# Patient Record
Sex: Female | Born: 1995 | Race: Black or African American | Hispanic: No | Smoking: Former smoker
Health system: Southern US, Community
[De-identification: ages and names within clinical notes are randomized; demographics above are authoritative.]

## PROBLEM LIST (undated history)

## (undated) ENCOUNTER — Inpatient Hospital Stay: Payer: Self-pay

## (undated) DIAGNOSIS — M940 Chondrocostal junction syndrome [Tietze]: Secondary | ICD-10-CM

## (undated) DIAGNOSIS — M549 Dorsalgia, unspecified: Secondary | ICD-10-CM

## (undated) DIAGNOSIS — D649 Anemia, unspecified: Secondary | ICD-10-CM

## (undated) HISTORY — PX: TONSILLECTOMY: SUR1361

---
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
## 2017-01-24 ENCOUNTER — Encounter: Payer: Self-pay | Admitting: Emergency Medicine

## 2017-01-24 ENCOUNTER — Emergency Department: Payer: Medicaid Other

## 2017-01-24 ENCOUNTER — Emergency Department
Admission: EM | Admit: 2017-01-24 | Discharge: 2017-01-24 | Disposition: A | Discharge status: Home / Self Care | Payer: Medicaid Other | Attending: Student in an Organized Health Care Education/Training Program | Admitting: Student in an Organized Health Care Education/Training Program

## 2017-01-24 DIAGNOSIS — M79661 Pain in right lower leg: Secondary | ICD-10-CM

## 2017-01-24 DIAGNOSIS — Z791 Long term (current) use of non-steroidal anti-inflammatories (NSAID): Secondary | ICD-10-CM | POA: Insufficient documentation

## 2017-01-24 DIAGNOSIS — Z79899 Other long term (current) drug therapy: Secondary | ICD-10-CM | POA: Diagnosis not present

## 2017-01-24 DIAGNOSIS — F17210 Nicotine dependence, cigarettes, uncomplicated: Secondary | ICD-10-CM | POA: Insufficient documentation

## 2017-01-24 MED ORDER — CYCLOBENZAPRINE HCL 5 MG PO TABS: 5.0000 mg | ORAL_TABLET | Freq: Three times a day (TID) | ORAL | 0 refills | Status: DC | PRN

## 2017-01-24 MED ORDER — NABUMETONE 750 MG PO TABS: 750.0000 mg | ORAL_TABLET | Freq: Two times a day (BID) | ORAL | 0 refills | Status: DC

## 2017-01-24 NOTE — ED Notes (Signed)
NAD noted at time of D/C. Pt denies questions or concerns. Pt ambulatory to the lobby at this time.  

## 2017-01-24 NOTE — Discharge Instructions (Signed)
Your exam and ultrasound were normal today. You should take the prescription meds as directed. Consider wearing compression socks or a neoprene sleeve. Restart your daily multi-vitamins and hydrate well. Follow-up with Dr. Tracey HarriesPringle for recurrent symptoms.

## 2017-01-24 NOTE — ED Provider Notes (Signed)
[facility] Emergency Department Provider Note ____________________________________________  Time seen: 1545  I have reviewed the triage vital signs and the nursing notes.  HISTORY  Chief Complaint  Leg Pain  HPI Michelle Washington is a 21 y.o. female presents to the ED for evaluation of right calf pain. She reports pain to the posterior calf she describes as "muscle spasms." She reports she has been pain free since the initial onset in October. She reports she dose the prescribed indomethacin for a few days, until symptoms resolved. She did follow-up with her PCP, who suggested she continue the prescribed meds and return is symptoms persisted. The patient denies any injury, trauma, or accidents in the interim. She reports onset last night, but did not dose any meds or start any interventions like ice or heat. She reports the pain as cramping and stabbing. She denies any chest pain, shortness of breath, leg swelling, or skin temp or color changes. She notes the pain is increased with walking and squeezing or touching the calf.   Past Medical History:  Diagnosis Date  . Anemia   . Back pain   . Costochondritis     There are no active problems to display for this patient.   Past Surgical History:  Procedure Laterality Date  . TONSILLECTOMY      Prior to Admission medications   Medication Sig Start Date End Date Taking? Authorizing Provider  azithromycin (ZITHROMAX Z-PAK) 250 MG tablet Take 2 tablets (500 mg) on  Day 1,  followed by 1 tablet (250 mg) once daily on Days 2 through 5. 11/15/16   Enid Derry, PA-C  benzonatate (TESSALON PERLES) 100 MG capsule Take 1 capsule (100 mg total) by mouth 3 (three) times daily as needed for cough. 11/15/16 11/15/17  Enid Derry, PA-C  cyclobenzaprine (FLEXERIL) 5 MG tablet Take 1 tablet (5 mg total) by mouth 3 (three) times daily as needed for muscle spasms. 01/24/17   Cloyce Blankenhorn V Bacon Saul Dorsi, PA-C  diazepam (VALIUM) 5 MG tablet  Take 1 tablet (5 mg total) by mouth every 8 (eight) hours as needed for muscle spasms. 10/04/16   Emily Filbert, MD  ibuprofen (ADVIL,MOTRIN) 800 MG tablet Take 1 tablet (800 mg total) by mouth every 8 (eight) hours as needed. 10/04/16   Emily Filbert, MD  nabumetone (RELAFEN) 750 MG tablet Take 1 tablet (750 mg total) by mouth 2 (two) times daily. 01/24/17   Chelcey Caputo V Bacon Bethel Gaglio, PA-C    Allergies Watermelon [citrullus vulgaris]  History reviewed. No pertinent family history.  Social History Social History  Substance Use Topics  . Smoking status: Current Some Day Smoker    Types: Cigarettes  . Smokeless tobacco: Never Used  . Alcohol use No    Review of Systems  Constitutional: Negative for fever. Cardiovascular: Negative for chest pain. Respiratory: Negative for shortness of breath. Gastrointestinal: Negative for abdominal pain, vomiting and diarrhea. Genitourinary: Negative for dysuria. Musculoskeletal: Negative for back pain. Right calf pain as above.  Skin: Negative for rash. Neurological: Negative for headaches, focal weakness or numbness. ____________________________________________  PHYSICAL EXAM:  VITAL SIGNS: ED Triage Vitals  Enc Vitals Group     BP 01/24/17 1520 126/69     Pulse Rate 01/24/17 1520 83     Resp 01/24/17 1520 18     Temp 01/24/17 1520 98.6 F (37 C)     Temp Source 01/24/17 1520 Oral     SpO2 01/24/17 1520 99 %     Weight  01/24/17 1521 188 lb (85.3 kg)     Height 01/24/17 1521 5\' 5"  (1.651 m)     Head Circumference --      Peak Flow --      Pain Score 01/24/17 1520 10     Pain Loc --      Pain Edu? --      Excl. in GC? --     Constitutional: Alert and oriented. Well appearing and in no distress. Head: Normocephalic and atraumatic. Cardiovascular: Normal rate, regular rhythm. Normal distal pulses. Respiratory: Normal respiratory effort. No wheezes/rales/rhonchi. Gastrointestinal: Soft and nontender. No  distention. Musculoskeletal: Right leg without obvious deformity, joint effusion, swelling, or dislocation. Normal knee exam without internal derangement. Patient is exquisitely tender to light touch and manipulation of the right gastrocnemius. No palpable muscle defect, cords, or hematoma noted. Normal foot/ankle ROM. No achilles tenderness. Nontender with normal range of motion in all extremities.  Neurologic:  Normal gait without ataxia. Normal speech and language. No gross focal neurologic deficits are appreciated. Skin:  Skin is warm, dry and intact. No rash noted. Psychiatric: Mood and affect are normal. Patient exhibits appropriate insight and judgment. ____________________________________________   RADIOLOGY  RLE Venous Ultrasound   IMPRESSION: No evidence of deep venous thrombosis. ____________________________________________  INITIAL IMPRESSION / ASSESSMENT AND PLAN / ED COURSE  Patient with recurrent right calf pain with a negative lower extremity Doppler ultrasound. Patient with otherwise benign exam without any signs of a deep muscle defect, neuromuscular deficit, or vascular insufficiency. She will follow with her primary care provider for ongoing symptom management. She is discharged with prescriptions for Flexeril and Relafen dose directed. She is further advised to consider the use of compression socks or any pain sleep with a right calf. She is also advised she should continue to hydrate well, dose or previously prescribed multivitamins, and consider stretching exercises. Return to the ED as needed for acutely worsening symptoms. ____________________________________________  FINAL CLINICAL IMPRESSION(S) / ED DIAGNOSES  Final diagnoses:  Right calf pain      Lissa HoardJenise V Bacon Sissy Goetzke, PA-C 01/24/17 1953    Willy EddyPatrick Robinson, MD 01/24/17 2106

## 2017-01-24 NOTE — ED Triage Notes (Signed)
Pt c/o cramping muscle spasm in right calf since yesterday.  Pain is intermittent. Ambulatory to triage.  No swelling noted.  No injury noted.  Walks a lot at work.

## 2017-02-15 ENCOUNTER — Encounter: Payer: Self-pay | Admitting: *Deleted

## 2017-02-15 ENCOUNTER — Emergency Department
Admission: EM | Admit: 2017-02-15 | Discharge: 2017-02-15 | Disposition: A | Discharge status: Home / Self Care | Payer: Medicaid Other | Attending: Student in an Organized Health Care Education/Training Program | Admitting: Student in an Organized Health Care Education/Training Program

## 2017-02-15 DIAGNOSIS — Z79899 Other long term (current) drug therapy: Secondary | ICD-10-CM | POA: Diagnosis not present

## 2017-02-15 DIAGNOSIS — Z791 Long term (current) use of non-steroidal anti-inflammatories (NSAID): Secondary | ICD-10-CM | POA: Insufficient documentation

## 2017-02-15 DIAGNOSIS — F17210 Nicotine dependence, cigarettes, uncomplicated: Secondary | ICD-10-CM | POA: Insufficient documentation

## 2017-02-15 DIAGNOSIS — R195 Other fecal abnormalities: Secondary | ICD-10-CM | POA: Diagnosis present

## 2017-02-15 DIAGNOSIS — K602 Anal fissure, unspecified: Secondary | ICD-10-CM | POA: Insufficient documentation

## 2017-02-15 DIAGNOSIS — K921 Melena: Secondary | ICD-10-CM | POA: Diagnosis not present

## 2017-02-15 LAB — CBC
HCT: 42.4 % (ref 35.0–47.0)
Hemoglobin: 13.9 g/dL (ref 12.0–16.0)
MCH: 26.1 pg (ref 26.0–34.0)
MCHC: 32.7 g/dL (ref 32.0–36.0)
MCV: 79.8 fL — ABNORMAL LOW (ref 80.0–100.0)
PLATELETS: 168 10*3/uL (ref 150–440)
RBC: 5.31 MIL/uL — ABNORMAL HIGH (ref 3.80–5.20)
RDW: 13.4 % (ref 11.5–14.5)
WBC: 8 10*3/uL (ref 3.6–11.0)

## 2017-02-15 LAB — COMPREHENSIVE METABOLIC PANEL
ALBUMIN: 3.3 g/dL — AB (ref 3.5–5.0)
ALK PHOS: 38 U/L (ref 38–126)
ALT: 7 U/L — AB (ref 14–54)
AST: 20 U/L (ref 15–41)
Anion gap: 4 — ABNORMAL LOW (ref 5–15)
BILIRUBIN TOTAL: 0.5 mg/dL (ref 0.3–1.2)
BUN: <5 mg/dL — ABNORMAL LOW (ref 6–20)
CO2: 25 mmol/L (ref 22–32)
CREATININE: 0.78 mg/dL (ref 0.44–1.00)
Calcium: 8.7 mg/dL — ABNORMAL LOW (ref 8.9–10.3)
Chloride: 109 mmol/L (ref 101–111)
GFR calc Af Amer: >60 mL/min (ref >60)
GFR calc non Af Amer: >60 mL/min (ref >60)
GLUCOSE: 98 mg/dL (ref 65–99)
Potassium: 3.6 mmol/L (ref 3.5–5.1)
SODIUM: 138 mmol/L (ref 135–145)
TOTAL PROTEIN: 5.9 g/dL — AB (ref 6.5–8.1)

## 2017-02-15 MED ORDER — POLYETHYLENE GLYCOL 3350 17 G PO PACK: 17.0000 g | PACK | Freq: Every day | ORAL | 0 refills | Status: DC

## 2017-02-15 MED ORDER — STARCH 51 % RE SUPP: 1.0000 | RECTAL | 0 refills | Status: DC | PRN

## 2017-02-15 NOTE — ED Provider Notes (Signed)
[facility] Emergency Department Provider Note    First MD Initiated Contact with Patient 02/15/17 1211     (approximate)  I have reviewed the triage vital signs and the nursing notes.   HISTORY  Chief Complaint Blood In Stools    HPI Michelle Washington is a 21 y.o. female presents with bright red blood noticed on the toilet paper after wiping today. Patient states she does have a history of constipation and has been straining. States that she's never really noted any bleeding until yesterday. States that she wiped yesterday noted bloody streaks on the toilet paper. Has not had any significant hematochezia but thinks that her stool is darker than usual. Denies any history of reflux. No history of heartburn. States he also had some episode of right lower quadrant pain over the past week.  No fevers. No nausea or vomiting. No family history of bleeding disorders. No family history of Crohn's or IBS. No history of ulcerative colitis.   Past Medical History:  Diagnosis Date  . Anemia   . Back pain   . Costochondritis    History reviewed. No pertinent family history. Past Surgical History:  Procedure Laterality Date  . TONSILLECTOMY     There are no active problems to display for this patient.     Prior to Admission medications   Medication Sig Start Date End Date Taking? Authorizing Provider  azithromycin (ZITHROMAX Z-PAK) 250 MG tablet Take 2 tablets (500 mg) on  Day 1,  followed by 1 tablet (250 mg) once daily on Days 2 through 5. 11/15/16   Enid Derry, PA-C  benzonatate (TESSALON PERLES) 100 MG capsule Take 1 capsule (100 mg total) by mouth 3 (three) times daily as needed for cough. 11/15/16 11/15/17  Enid Derry, PA-C  cyclobenzaprine (FLEXERIL) 5 MG tablet Take 1 tablet (5 mg total) by mouth 3 (three) times daily as needed for muscle spasms. 01/24/17   Jenise V Bacon Menshew, PA-C  diazepam (VALIUM) 5 MG tablet Take 1 tablet (5 mg total) by mouth  every 8 (eight) hours as needed for muscle spasms. 10/04/16   Emily Filbert, MD  ibuprofen (ADVIL,MOTRIN) 800 MG tablet Take 1 tablet (800 mg total) by mouth every 8 (eight) hours as needed. 10/04/16   Emily Filbert, MD  nabumetone (RELAFEN) 750 MG tablet Take 1 tablet (750 mg total) by mouth 2 (two) times daily. 01/24/17   Jenise Marcelyn Bruins Menshew, PA-C    Allergies Watermelon [citrullus vulgaris]    Social History Social History  Substance Use Topics  . Smoking status: Current Some Day Smoker    Types: Cigarettes  . Smokeless tobacco: Never Used  . Alcohol use No    Review of Systems Patient denies headaches, rhinorrhea, blurry vision, numbness, shortness of breath, chest pain, edema, cough, abdominal pain, nausea, vomiting, diarrhea, dysuria, fevers, rashes or hallucinations unless otherwise stated above in HPI. ____________________________________________   PHYSICAL EXAM:  VITAL SIGNS: Vitals:   02/15/17 1108  BP: 120/75  Pulse: 96  Resp: 15  Temp: 99.1 F (37.3 C)    Constitutional: Alert and oriented. Well appearing and in no acute distress. Eyes: Conjunctivae are normal. PERRL. EOMI. Head: Atraumatic. Nose: No congestion/rhinnorhea. Mouth/Throat: Mucous membranes are moist.  Oropharynx non-erythematous. Neck: No stridor. Painless ROM. No cervical spine tenderness to palpation Hematological/Lymphatic/Immunilogical: No cervical lymphadenopathy. Cardiovascular: Normal rate, regular rhythm. Grossly normal heart sounds.  Good peripheral circulation. Respiratory: Normal respiratory effort.  No retractions. Lungs CTAB. Gastrointestinal: Soft and  nontender. No distention. No abdominal bruits. No CVA tenderness. Genitourinary: No rectal masses. No evidence of fistula. Does have tenderness to palpation on right side of her rectum with streak of bright blood consistent with anal fissure. May have early developing hemorrhoid but no evidence of thrombosed  hemorrhoid. Musculoskeletal: No lower extremity tenderness nor edema.  No joint effusions. Neurologic:  Normal speech and language. No gross focal neurologic deficits are appreciated. No gait instability. Skin:  Skin is warm, dry and intact. No rash noted. Psychiatric: Mood and affect are normal. Speech and behavior are normal.  ____________________________________________   LABS (all labs ordered are listed, but only abnormal results are displayed)  Results for orders placed or performed during the hospital encounter of 02/15/17 (from the past 24 hour(s))  Comprehensive metabolic panel     Status: Abnormal   Collection Time: 02/15/17 11:20 AM  Result Value Ref Range   Sodium 138 135 - 145 mmol/L   Potassium 3.6 3.5 - 5.1 mmol/L   Chloride 109 101 - 111 mmol/L   CO2 25 22 - 32 mmol/L   Glucose, Bld 98 65 - 99 mg/dL   BUN <5 (L) 6 - 20 mg/dL   Creatinine, Ser 1.61 0.44 - 1.00 mg/dL   Calcium 8.7 (L) 8.9 - 10.3 mg/dL   Total Protein 5.9 (L) 6.5 - 8.1 g/dL   Albumin 3.3 (L) 3.5 - 5.0 g/dL   AST 20 15 - 41 U/L   ALT 7 (L) 14 - 54 U/L   Alkaline Phosphatase 38 38 - 126 U/L   Total Bilirubin 0.5 0.3 - 1.2 mg/dL   GFR calc non Af Amer >60 >60 mL/min   GFR calc Af Amer >60 >60 mL/min   Anion gap 4 (L) 5 - 15  CBC     Status: Abnormal   Collection Time: 02/15/17 11:20 AM  Result Value Ref Range   WBC 8.0 3.6 - 11.0 K/uL   RBC 5.31 (H) 3.80 - 5.20 MIL/uL   Hemoglobin 13.9 12.0 - 16.0 g/dL   HCT 09.6 04.5 - 40.9 %   MCV 79.8 (L) 80.0 - 100.0 fL   MCH 26.1 26.0 - 34.0 pg   MCHC 32.7 32.0 - 36.0 g/dL   RDW 81.1 91.4 - 78.2 %   Platelets 168 150 - 440 K/uL   ____________________________________________  ____________________________________________  RADIOLOGY   ____________________________________________   PROCEDURES  Procedure(s) performed:  Procedures    Critical Care performed: no ____________________________________________   INITIAL IMPRESSION / ASSESSMENT AND  PLAN / ED COURSE  Pertinent labs & imaging results that were available during my care of the patient were reviewed by me and considered in my medical decision making (see chart for details).  DDX: hemorrhoid, fissue, procititis, colitis  Michelle Washington is a 21 y.o. who presents to the ED with Concern for blood on tissue paper S described above. Patient afebrile and hemodynamically stable.  Abdominal exam is soft and benign. Blood work is reassuring. There is no leukocytosis. Enough doses clinically consistent with acute surgical process. Not consistent with appendicitis. Less likely IBD. Given history most consistent with anal fissure or hemorrhoid. Will perform a rectal exam.  Clinical Course as of Feb 16 1320  Fri Feb 15, 2017  1317  Rectal exam has probable anal fissure located on right side. Also has a skin tag in that area. No evidence of proctitis. May have early developing hemorrhoid but no evidence of thrombosed hemorrhoids. Patient describing history of straining therefore will give  laxatives. We'll provide Anusol cream and follow-up with PCP.  [PR]    Clinical Course User Index [PR] Willy Eddy, MD     ____________________________________________   FINAL CLINICAL IMPRESSION(S) / ED DIAGNOSES  Final diagnoses:  Anal fissure  Blood in stool      NEW MEDICATIONS STARTED DURING THIS VISIT:  New Prescriptions   No medications on file     Note:  This document was prepared using Dragon voice recognition software and may include unintentional dictation errors.    Willy Eddy, MD 02/15/17 1322

## 2017-02-15 NOTE — ED Notes (Signed)
Pt reports that she had has blood in stool for the last week - blood is bright red in color and started a week ago she was only able to see when she wiped - today the amount increased - pt c/o diarrhea (1 loose stool in 24 hours) with right side pain that is off and on for the last several weeks

## 2017-02-15 NOTE — ED Notes (Signed)
FIRST NURSE: pt c/o bloody stools x1 week.

## 2017-02-15 NOTE — ED Triage Notes (Signed)
PT verbalized having had a couple days of GI bleeding. Pt reports bleeding worsened today and noticed bright red blood on tissue after having a BM. PT reports having diarrhea but is unsure if stool was also dark or black in color. Pt reports being anemic so she is lightheaded and dizzy frequently but is reporting new onset of right lower abd pain. No nausea vomiting.

## 2017-04-06 ENCOUNTER — Emergency Department
Admission: EM | Admit: 2017-04-06 | Discharge: 2017-04-06 | Disposition: A | Discharge status: Home / Self Care | Payer: Medicaid Other | Attending: Emergency Medicine | Admitting: Emergency Medicine

## 2017-04-06 ENCOUNTER — Encounter: Payer: Self-pay | Admitting: Emergency Medicine

## 2017-04-06 DIAGNOSIS — R252 Cramp and spasm: Secondary | ICD-10-CM | POA: Diagnosis present

## 2017-04-06 DIAGNOSIS — F17210 Nicotine dependence, cigarettes, uncomplicated: Secondary | ICD-10-CM | POA: Diagnosis not present

## 2017-04-06 MED ORDER — NAPROXEN 500 MG PO TABS: 500.0000 mg | ORAL_TABLET | Freq: Two times a day (BID) | ORAL | 0 refills | Status: DC

## 2017-04-06 MED ORDER — CYCLOBENZAPRINE HCL 5 MG PO TABS: 5.0000 mg | ORAL_TABLET | Freq: Three times a day (TID) | ORAL | 0 refills | Status: DC | PRN

## 2017-04-06 NOTE — ED Triage Notes (Signed)
Presents with muscle spasm like pain to both lower legs  Pain starts from knee and radiates into lower legs  Denies any other sx' but has had similar episodes in past  Ambulates well to treat room

## 2017-04-06 NOTE — Discharge Instructions (Signed)
Follow up with your primary care provider for symptoms that are not improving over the next few days.  Return to the ER for symptoms that change or worsen if unable to schedule an appointment. 

## 2017-04-06 NOTE — ED Provider Notes (Signed)
[facility] Emergency Department Provider Note ____________________________________________  Time seen: Approximately 9:53 AM  I have reviewed the triage vital signs and the nursing notes.   HISTORY  Chief Complaint Spasms    HPI Michelle Washington is a 21 y.o. female who presents to the emergency department for evaluation of bilateral calf cramping. She has these symptoms off and on. She states that this episode woke her out of her sleep last night and has been intermittentsince. Pain worsens with movement. She states that she was evaluated here a couple of months ago and prescribed some medication that helped, but she doesn't remember what it was. She hasn't taken anything since symptoms returned last night.   Past Medical History:  Diagnosis Date  . Anemia   . Back pain   . Costochondritis     There are no active problems to display for this patient.   Past Surgical History:  Procedure Laterality Date  . TONSILLECTOMY      Prior to Admission medications   Medication Sig Start Date End Date Taking? Authorizing Provider  azithromycin (ZITHROMAX Z-PAK) 250 MG tablet Take 2 tablets (500 mg) on  Day 1,  followed by 1 tablet (250 mg) once daily on Days 2 through 5. 11/15/16   Enid DerryWagner, Ashley, PA-C  benzonatate (TESSALON PERLES) 100 MG capsule Take 1 capsule (100 mg total) by mouth 3 (three) times daily as needed for cough. 11/15/16 11/15/17  Enid DerryWagner, Ashley, PA-C  cyclobenzaprine (FLEXERIL) 5 MG tablet Take 1 tablet (5 mg total) by mouth 3 (three) times daily as needed for muscle spasms. 04/06/17   Aneira Cavitt B, FNP  diazepam (VALIUM) 5 MG tablet Take 1 tablet (5 mg total) by mouth every 8 (eight) hours as needed for muscle spasms. 10/04/16   Emily FilbertWilliams, Jonathan E, MD  ibuprofen (ADVIL,MOTRIN) 800 MG tablet Take 1 tablet (800 mg total) by mouth every 8 (eight) hours as needed. 10/04/16   Emily FilbertWilliams, Jonathan E, MD  nabumetone (RELAFEN) 750 MG tablet Take 1 tablet  (750 mg total) by mouth 2 (two) times daily. 01/24/17   Menshew, Charlesetta IvoryJenise V Bacon, PA-C  naproxen (NAPROSYN) 500 MG tablet Take 1 tablet (500 mg total) by mouth 2 (two) times daily with a meal. 04/06/17   Takia Runyon B, FNP  polyethylene glycol (MIRALAX / GLYCOLAX) packet Take 17 g by mouth daily. Mix one tablespoon with 8oz of your favorite juice or water every day until you are having soft formed stools. Then start taking once daily if you didn't have a stool the day before. 02/15/17   Willy Eddyobinson, Patrick, MD  starch (ANUSOL) 51 % suppository Place 1 suppository rectally as needed for pain. 02/15/17   Willy Eddyobinson, Patrick, MD    Allergies Watermelon [citrullus vulgaris]  No family history on file.  Social History Social History  Substance Use Topics  . Smoking status: Current Some Day Smoker    Types: Cigarettes  . Smokeless tobacco: Never Used  . Alcohol use No    Review of Systems Constitutional: Well appearing. Respiratory: Respirations even and unlabored. Musculoskeletal: Positive for bilateral calf cramping. Skin: Negative for rash, lesion, or wound.  Neurological: Negative for decrease in sensation.  ____________________________________________   PHYSICAL EXAM:  VITAL SIGNS: ED Triage Vitals  Enc Vitals Group     BP -- 122/74     Pulse -- 95     Resp -- 20     Temp -- 98.6     Temp src --  SpO2 --      Weight 04/06/17 0950 188 lb (85.3 kg)     Height 04/06/17 0950 5\' 5"  (1.651 m)     Head Circumference --      Peak Flow --      Pain Score 04/06/17 0949 10     Pain Loc --      Pain Edu? --      Excl. in GC? --     Constitutional: Alert and oriented. Well appearing and in no acute distress. Eyes: Conjunctiva clear without drainage or discharge.  Head: Atraumatic Neck: No stridor. Respiratory: Respirations are even and unlabored. Musculoskeletal: No palpable spasms of the calves noted at time of exam. Neurologic: No loss of sensation or paresthesias.  Skin:  Warm and dry without rash, lesion, or wound.  Psychiatric: Normal behavior and affect.  ____________________________________________   LABS (all labs ordered are listed, but only abnormal results are displayed)  Labs Reviewed - No data to display ____________________________________________  RADIOLOGY  Not indicated. ____________________________________________   PROCEDURES  Procedure(s) performed: None  ____________________________________________   INITIAL IMPRESSION / ASSESSMENT AND PLAN / ED COURSE  Panama Challis is a 21 y.o. female who presents to the emergency department for evaluation of bilateral lower extremity cramping. She will be treated with Flexeril and naprosyn and advised to follow up with her PCP for symptoms that are not improving over the next few days.  Pertinent labs & imaging results that were available during my care of the patient were reviewed by me and considered in my medical decision making (see chart for details).  _________________________________________   FINAL CLINICAL IMPRESSION(S) / ED DIAGNOSES  Final diagnoses:  Muscle cramping    New Prescriptions   NAPROXEN (NAPROSYN) 500 MG TABLET    Take 1 tablet (500 mg total) by mouth 2 (two) times daily with a meal.    If controlled substance prescribed during this visit, 12 month history viewed on the [facility] prior to issuing an initial prescription for Schedule II or III opiod.    Chinita Pester, FNP 04/06/17 1011    Sharman Cheek, MD 04/06/17 1155

## 2017-06-26 ENCOUNTER — Encounter: Payer: Self-pay | Admitting: Emergency Medicine

## 2017-06-26 ENCOUNTER — Emergency Department: Payer: Self-pay

## 2017-06-26 ENCOUNTER — Emergency Department
Admission: EM | Admit: 2017-06-26 | Discharge: 2017-06-26 | Disposition: A | Discharge status: Home / Self Care | Payer: Self-pay | Attending: Student in an Organized Health Care Education/Training Program | Admitting: Student in an Organized Health Care Education/Training Program

## 2017-06-26 DIAGNOSIS — F17290 Nicotine dependence, other tobacco product, uncomplicated: Secondary | ICD-10-CM | POA: Insufficient documentation

## 2017-06-26 DIAGNOSIS — R1031 Right lower quadrant pain: Secondary | ICD-10-CM

## 2017-06-26 DIAGNOSIS — Z79899 Other long term (current) drug therapy: Secondary | ICD-10-CM | POA: Insufficient documentation

## 2017-06-26 LAB — CBC
HCT: 39.9 % (ref 35.0–47.0)
Hemoglobin: 13.1 g/dL (ref 12.0–16.0)
MCH: 26.1 pg (ref 26.0–34.0)
MCHC: 32.9 g/dL (ref 32.0–36.0)
MCV: 79.2 fL — ABNORMAL LOW (ref 80.0–100.0)
PLATELETS: 142 10*3/uL — AB (ref 150–440)
RBC: 5.03 MIL/uL (ref 3.80–5.20)
RDW: 12.6 % (ref 11.5–14.5)
WBC: 7.4 10*3/uL (ref 3.6–11.0)

## 2017-06-26 LAB — COMPREHENSIVE METABOLIC PANEL
ALBUMIN: 4 g/dL (ref 3.5–5.0)
ALK PHOS: 40 U/L (ref 38–126)
ALT: 7 U/L — ABNORMAL LOW (ref 14–54)
AST: 16 U/L (ref 15–41)
Anion gap: 6 (ref 5–15)
BUN: 8 mg/dL (ref 6–20)
CALCIUM: 9.1 mg/dL (ref 8.9–10.3)
CO2: 26 mmol/L (ref 22–32)
Chloride: 107 mmol/L (ref 101–111)
Creatinine, Ser: 0.78 mg/dL (ref 0.44–1.00)
GFR calc Af Amer: >60 mL/min (ref >60)
GLUCOSE: 110 mg/dL — AB (ref 65–99)
Potassium: 3.7 mmol/L (ref 3.5–5.1)
Sodium: 139 mmol/L (ref 135–145)
Total Bilirubin: 0.7 mg/dL (ref 0.3–1.2)
Total Protein: 6.9 g/dL (ref 6.5–8.1)

## 2017-06-26 LAB — URINALYSIS, COMPLETE (UACMP) WITH MICROSCOPIC
Bilirubin Urine: NEGATIVE
Glucose, UA: NEGATIVE mg/dL
HGB URINE DIPSTICK: NEGATIVE
Ketones, ur: NEGATIVE mg/dL
NITRITE: NEGATIVE
Protein, ur: NEGATIVE mg/dL
SPECIFIC GRAVITY, URINE: 1.021 (ref 1.005–1.030)
pH: 7 (ref 5.0–8.0)

## 2017-06-26 LAB — LIPASE, BLOOD: Lipase: 28 U/L (ref 11–51)

## 2017-06-26 MED ORDER — SODIUM CHLORIDE 0.9 % IV BOLUS (SEPSIS)
1000.0000 mL | Freq: Once | INTRAVENOUS | Status: AC
  Administered 2017-06-26: 1000 mL via INTRAVENOUS

## 2017-06-26 MED ORDER — DICYCLOMINE HCL 10 MG PO CAPS: 10.0000 mg | ORAL_CAPSULE | Freq: Three times a day (TID) | ORAL | 0 refills | Status: DC | PRN

## 2017-06-26 MED ORDER — MORPHINE SULFATE (PF) 4 MG/ML IV SOLN
4.0000 mg | INTRAVENOUS | Status: DC | PRN
  Administered 2017-06-26: 4 mg via INTRAVENOUS
  Filled 2017-06-26: qty 1

## 2017-06-26 MED ORDER — MAGNESIUM CITRATE PO SOLN: 1.0000 | Freq: Once | ORAL | 0 refills | Status: AC

## 2017-06-26 MED ORDER — IOPAMIDOL (ISOVUE-300) INJECTION 61%
100.0000 mL | Freq: Once | INTRAVENOUS | Status: AC | PRN
  Administered 2017-06-26: 100 mL via INTRAVENOUS
  Filled 2017-06-26: qty 100

## 2017-06-26 MED ORDER — PROMETHAZINE HCL 25 MG/ML IJ SOLN
12.5000 mg | Freq: Four times a day (QID) | INTRAMUSCULAR | Status: DC | PRN
  Administered 2017-06-26: 12.5 mg via INTRAVENOUS
  Filled 2017-06-26: qty 1

## 2017-06-26 NOTE — ED Notes (Signed)
POCT urine pregnancy negative 

## 2017-06-26 NOTE — ED Provider Notes (Signed)
[facility] Emergency Department Provider Note    First MD Initiated Contact with Patient 06/26/17 1606     (approximate)  I have reviewed the triage vital signs and the nursing notes.   HISTORY  Chief Complaint Abdominal Pain    HPI Michelle Washington is a 21 y.o. female presents from home with chief complaint of one day of progressive worsening 10/10 aching right lower quadrant pain associated with decreased appetite. Has had some chills but no measured fevers. No vomiting. States that she has a history of constipation and thought maybe it's related to her constipation. Denies any vaginal bleeding. No vaginal discharge. No recent abdominal surgeries.   Past Medical History:  Diagnosis Date  . Anemia   . Back pain   . Costochondritis    History reviewed. No pertinent family history. Past Surgical History:  Procedure Laterality Date  . TONSILLECTOMY     There are no active problems to display for this patient.     Prior to Admission medications   Medication Sig Start Date End Date Taking? Authorizing Provider  azithromycin (ZITHROMAX Z-PAK) 250 MG tablet Take 2 tablets (500 mg) on  Day 1,  followed by 1 tablet (250 mg) once daily on Days 2 through 5. 11/15/16   Enid Derry, PA-C  benzonatate (TESSALON PERLES) 100 MG capsule Take 1 capsule (100 mg total) by mouth 3 (three) times daily as needed for cough. 11/15/16 11/15/17  Enid Derry, PA-C  cyclobenzaprine (FLEXERIL) 5 MG tablet Take 1 tablet (5 mg total) by mouth 3 (three) times daily as needed for muscle spasms. 04/06/17   Triplett, Cari B, FNP  diazepam (VALIUM) 5 MG tablet Take 1 tablet (5 mg total) by mouth every 8 (eight) hours as needed for muscle spasms. 10/04/16   Emily Filbert, MD  dicyclomine (BENTYL) 10 MG capsule Take 1 capsule (10 mg total) by mouth 3 (three) times daily as needed for spasms. 06/26/17 07/10/17  Willy Eddy, MD  ibuprofen (ADVIL,MOTRIN) 800 MG tablet Take 1  tablet (800 mg total) by mouth every 8 (eight) hours as needed. 10/04/16   Emily Filbert, MD  magnesium citrate SOLN Take 296 mLs (1 Bottle total) by mouth once. 06/26/17 06/26/17  Willy Eddy, MD  nabumetone (RELAFEN) 750 MG tablet Take 1 tablet (750 mg total) by mouth 2 (two) times daily. 01/24/17   Menshew, Charlesetta Ivory, PA-C  naproxen (NAPROSYN) 500 MG tablet Take 1 tablet (500 mg total) by mouth 2 (two) times daily with a meal. 04/06/17   Triplett, Cari B, FNP  polyethylene glycol (MIRALAX / GLYCOLAX) packet Take 17 g by mouth daily. Mix one tablespoon with 8oz of your favorite juice or water every day until you are having soft formed stools. Then start taking once daily if you didn't have a stool the day before. 02/15/17   Willy Eddy, MD  starch (ANUSOL) 51 % suppository Place 1 suppository rectally as needed for pain. 02/15/17   Willy Eddy, MD    Allergies Watermelon [citrullus vulgaris]    Social History Social History  Substance Use Topics  . Smoking status: Current Some Day Smoker    Packs/day: 1.00    Types: Cigars  . Smokeless tobacco: Never Used  . Alcohol use No    Review of Systems Patient denies headaches, rhinorrhea, blurry vision, numbness, shortness of breath, chest pain, edema, cough, abdominal pain, nausea, vomiting, diarrhea, dysuria, fevers, rashes or hallucinations unless otherwise stated above in HPI. ____________________________________________  PHYSICAL EXAM:  VITAL SIGNS: Vitals:   06/26/17 1546  BP: 117/69  Pulse: (!) 110  Resp: 18  Temp: 99 F (37.2 C)  SpO2: 98%    Constitutional: Alert and oriented. Well appearing and in no acute distress. Eyes: Conjunctivae are normal.  Head: Atraumatic. Nose: No congestion/rhinnorhea. Mouth/Throat: Mucous membranes are moist.   Neck: No stridor. Painless ROM.  Cardiovascular: Normal rate, regular rhythm. Grossly normal heart sounds.  Good peripheral circulation. Respiratory:  Normal respiratory effort.  No retractions. Lungs CTAB. Gastrointestinal: Soft but with acute RLQ ttp, no guarding or rebound. No distention. No abdominal bruits. No CVA tenderness. Genitourinary:  Musculoskeletal: No lower extremity tenderness nor edema.  No joint effusions. Neurologic:  Normal speech and language. No gross focal neurologic deficits are appreciated. No facial droop Skin:  Skin is warm, dry and intact. No rash noted. Psychiatric: Mood and affect are normal. Speech and behavior are normal.  ____________________________________________   LABS (all labs ordered are listed, but only abnormal results are displayed)  Results for orders placed or performed during the hospital encounter of 06/26/17 (from the past 24 hour(s))  Lipase, blood     Status: None   Collection Time: 06/26/17  3:55 PM  Result Value Ref Range   Lipase 28 11 - 51 U/L  Comprehensive metabolic panel     Status: Abnormal   Collection Time: 06/26/17  3:55 PM  Result Value Ref Range   Sodium 139 135 - 145 mmol/L   Potassium 3.7 3.5 - 5.1 mmol/L   Chloride 107 101 - 111 mmol/L   CO2 26 22 - 32 mmol/L   Glucose, Bld 110 (H) 65 - 99 mg/dL   BUN 8 6 - 20 mg/dL   Creatinine, Ser 9.60 0.44 - 1.00 mg/dL   Calcium 9.1 8.9 - 45.4 mg/dL   Total Protein 6.9 6.5 - 8.1 g/dL   Albumin 4.0 3.5 - 5.0 g/dL   AST 16 15 - 41 U/L   ALT 7 (L) 14 - 54 U/L   Alkaline Phosphatase 40 38 - 126 U/L   Total Bilirubin 0.7 0.3 - 1.2 mg/dL   GFR calc non Af Amer >60 >60 mL/min   GFR calc Af Amer >60 >60 mL/min   Anion gap 6 5 - 15  CBC     Status: Abnormal   Collection Time: 06/26/17  3:55 PM  Result Value Ref Range   WBC 7.4 3.6 - 11.0 K/uL   RBC 5.03 3.80 - 5.20 MIL/uL   Hemoglobin 13.1 12.0 - 16.0 g/dL   HCT 09.8 11.9 - 14.7 %   MCV 79.2 (L) 80.0 - 100.0 fL   MCH 26.1 26.0 - 34.0 pg   MCHC 32.9 32.0 - 36.0 g/dL   RDW 82.9 56.2 - 13.0 %   Platelets 142 (L) 150 - 440 K/uL  Urinalysis, Complete w Microscopic     Status:  Abnormal   Collection Time: 06/26/17  3:55 PM  Result Value Ref Range   Color, Urine YELLOW (A) YELLOW   APPearance CLEAR (A) CLEAR   Specific Gravity, Urine 1.021 1.005 - 1.030   pH 7.0 5.0 - 8.0   Glucose, UA NEGATIVE NEGATIVE mg/dL   Hgb urine dipstick NEGATIVE NEGATIVE   Bilirubin Urine NEGATIVE NEGATIVE   Ketones, ur NEGATIVE NEGATIVE mg/dL   Protein, ur NEGATIVE NEGATIVE mg/dL   Nitrite NEGATIVE NEGATIVE   Leukocytes, UA TRACE (A) NEGATIVE   RBC / HPF 0-5 0 - 5 RBC/hpf   WBC,  UA 0-5 0 - 5 WBC/hpf   Bacteria, UA RARE (A) NONE SEEN   Squamous Epithelial / LPF 0-5 (A) NONE SEEN   Mucus PRESENT    ____________________________________________ ____________________________________________  RADIOLOGY  I personally reviewed all radiographic images ordered to evaluate for the above acute complaints and reviewed radiology reports and findings.  These findings were personally discussed with the patient.  Please see medical record for radiology report.  ____________________________________________   PROCEDURES  Procedure(s) performed:  Procedures    Critical Care performed: no ____________________________________________   INITIAL IMPRESSION / ASSESSMENT AND PLAN / ED COURSE  Pertinent labs & imaging results that were available during my care of the patient were reviewed by me and considered in my medical decision making (see chart for details).  DDX: appendicitis, uti, colitis, constipation, stone, toa, cyst  Michelle Gates is a 21 y.o. who presents to the ED with Acute right lower quadrant abdominal pain. Patient does have tenderness and I'm concerned for localized peritonitis. She's afebrile. Blood work does not show any leukocytosis but based on her presentation do believe that she needs CT imaging to rule out any evidence of appendicitis.  We'll give IV fluids as well as IV pain medication.  Clinical Course as of Jun 26 1849  Wed Jun 26, 2017  1738 Patient feels  improved after IV pain medication. CT imaging shows no acute abnormality to explain her symptoms. On repeat exam she does still have some mild tenderness on the right side based on her age will order ultrasound of the ovaries to rule out ovarian torsion.  Again patient denies any vaginal discharge or history of STI's.  [PR]  1847 US Transvaginal Non-OB [PR]    Clinical Course User Index [PR] Willy Eddy, MD     ____________________________________________   FINAL CLINICAL IMPRESSION(S) / ED DIAGNOSES  Final diagnoses:  RLQ abdominal pain      NEW MEDICATIONS STARTED DURING THIS VISIT:  New Prescriptions   DICYCLOMINE (BENTYL) 10 MG CAPSULE    Take 1 capsule (10 mg total) by mouth 3 (three) times daily as needed for spasms.   MAGNESIUM CITRATE SOLN    Take 296 mLs (1 Bottle total) by mouth once.     Note:  This document was prepared using Dragon voice recognition software and may include unintentional dictation errors.    Willy Eddy, MD 06/26/17 [phone]

## 2017-06-26 NOTE — ED Notes (Signed)
Patient transported to CT 

## 2017-06-26 NOTE — Discharge Instructions (Signed)

## 2017-06-26 NOTE — ED Triage Notes (Signed)
Pt presents with right-sided abdominal pain accompanied by constipation that she has had "since I was 18 or 19." She reports that the pain is worse today than it normally is, and that she has been constipated as well. Pt alert & oriented with NAD noted.

## 2017-11-11 ENCOUNTER — Encounter: Payer: Self-pay | Admitting: Emergency Medicine

## 2017-11-11 ENCOUNTER — Emergency Department
Admission: EM | Admit: 2017-11-11 | Discharge: 2017-11-11 | Disposition: A | Discharge status: Home / Self Care | Payer: Self-pay | Attending: Emergency Medicine | Admitting: Emergency Medicine

## 2017-11-11 ENCOUNTER — Other Ambulatory Visit: Payer: Self-pay

## 2017-11-11 ENCOUNTER — Emergency Department: Payer: Self-pay

## 2017-11-11 DIAGNOSIS — F17290 Nicotine dependence, other tobacco product, uncomplicated: Secondary | ICD-10-CM | POA: Insufficient documentation

## 2017-11-11 DIAGNOSIS — Z79899 Other long term (current) drug therapy: Secondary | ICD-10-CM | POA: Insufficient documentation

## 2017-11-11 DIAGNOSIS — B349 Viral infection, unspecified: Secondary | ICD-10-CM | POA: Insufficient documentation

## 2017-11-11 LAB — POCT PREGNANCY, URINE: Preg Test, Ur: NEGATIVE

## 2017-11-11 LAB — GROUP A STREP BY PCR: Group A Strep by PCR: NOT DETECTED

## 2017-11-11 MED ORDER — ONDANSETRON 4 MG PO TBDP: 4.0000 mg | ORAL_TABLET | Freq: Three times a day (TID) | ORAL | 0 refills | Status: DC | PRN

## 2017-11-11 MED ORDER — ONDANSETRON 4 MG PO TBDP
4.0000 mg | ORAL_TABLET | Freq: Once | ORAL | Status: AC
  Administered 2017-11-11: 4 mg via ORAL
  Filled 2017-11-11: qty 1

## 2017-11-11 NOTE — Discharge Instructions (Signed)
Follow-up with your primary care provider if any continued problems. Zofran every 8 hours as needed for nausea. Increase fluids. Tylenol if needed for fever or body aches.

## 2017-11-11 NOTE — ED Triage Notes (Addendum)
Sore throat x 5 days. States has coughed until she vomits during this time but is keeping fluids down.

## 2017-11-11 NOTE — ED Provider Notes (Signed)
[facility] Emergency Department Provider Note   ____________________________________________   First MD Initiated Contact with Patient 11/11/17 0848     (approximate)  I have reviewed the triage vital signs and the nursing notes.   HISTORY  Chief Complaint Sore Throat   HPI Michelle Washington is a 22 y.o. female complaints  of sore throat. Patient states that she has vomited twice this morning.at this time she states that she is able to keep fluids down and denies any diarrhea. She also has had a cough for several days. She denies any fever at home.  She rates her pain as a 10 over 10.   Past Medical History:  Diagnosis Date  . Anemia   . Back pain   . Costochondritis     There are no active problems to display for this patient.   Past Surgical History:  Procedure Laterality Date  . TONSILLECTOMY      Prior to Admission medications   Medication Sig Start Date End Date Taking? Authorizing Provider  dicyclomine (BENTYL) 10 MG capsule Take 1 capsule (10 mg total) by mouth 3 (three) times daily as needed for spasms. 06/26/17 07/10/17  Willy Eddyobinson, Patrick, MD  ondansetron (ZOFRAN ODT) 4 MG disintegrating tablet Take 1 tablet (4 mg total) by mouth every 8 (eight) hours as needed for nausea or vomiting. 11/11/17   Tommi RumpsSummers, Naba Sneed L, PA-C  polyethylene glycol (MIRALAX / GLYCOLAX) packet Take 17 g by mouth daily. Mix one tablespoon with 8oz of your favorite juice or water every day until you are having soft formed stools. Then start taking once daily if you didn't have a stool the day before. 02/15/17   Willy Eddyobinson, Patrick, MD  starch (ANUSOL) 51 % suppository Place 1 suppository rectally as needed for pain. 02/15/17   Willy Eddyobinson, Patrick, MD    Allergies Watermelon [citrullus vulgaris]  No family history on file.  Social History Social History   Tobacco Use  . Smoking status: Current Some Day Smoker    Packs/day: 1.00    Types: Cigars  . Smokeless tobacco:  Never Used  Substance Use Topics  . Alcohol use: No  . Drug use: No    Review of Systems Constitutional: No fever/chills Eyes: No visual changes. ENT: positive sore throat. Cardiovascular: Denies chest pain. Respiratory: Denies shortness of breath. Gastrointestinal: No abdominal pain.  positive nausea, positive vomiting.  No diarrhea.   Genitourinary: Negative for dysuria. Musculoskeletal: Negative for back pain. Skin: Negative for rash. Neurological: Negative for headaches, focal weakness or numbness. ____________________________________________   PHYSICAL EXAM:  VITAL SIGNS: ED Triage Vitals  Enc Vitals Group     BP 11/11/17 0820 (!) 133/92     Pulse Rate 11/11/17 0820 85     Resp 11/11/17 0820 20     Temp 11/11/17 0820 99.6 F (37.6 C)     Temp Source 11/11/17 0820 Oral     SpO2 11/11/17 0820 99 %     Weight 11/11/17 0820 200 lb (90.7 kg)     Height 11/11/17 0820 5\' 6"  (1.676 m)     Head Circumference --      Peak Flow --      Pain Score 11/11/17 0819 10     Pain Loc --      Pain Edu? --      Excl. in GC? --    Constitutional: Alert and oriented. Well appearing and in no acute distress. Eyes: Conjunctivae are normal.  Head: Atraumatic. Nose: No congestion/rhinnorhea.  EACs are clear. TMs are dull bilaterally. Mouth/Throat: Mucous membranes are moist.  Oropharynx non-erythematous. Mild posterior drainage noted. No exudates present. Neck: No stridor.   Hematological/Lymphatic/Immunilogical: No cervical lymphadenopathy. Cardiovascular: Normal rate, regular rhythm. Grossly normal heart sounds.  Good peripheral circulation. Respiratory: Normal respiratory effort.  No retractions. Lungs CTAB. Coarse cough is heard. Gastrointestinal: Soft and nontender. No distention. owel sounds normal active 4 quadrants. Musculoskeletal: moves upper and lower extremities without difficulty. Normal gait was noted. Neurologic:  Normal speech and language. No gross focal neurologic  deficits are appreciated. No gait instability. Skin:  Skin is warm, dry and intact. No rash noted. Psychiatric: Mood and affect are normal. Speech and behavior are normal.  ____________________________________________   LABS (all labs ordered are listed, but only abnormal results are displayed)  Labs Reviewed  GROUP A STREP BY PCR  POC URINE PREG, ED  POCT PREGNANCY, URINE    RADIOLOGY  Dg Chest 2 View  Result Date: 11/11/2017 CLINICAL DATA:  Sore throat, cough EXAM: CHEST  2 VIEW COMPARISON:  11/15/2016 FINDINGS: Heart and mediastinal contours are within normal limits. No focal opacities or effusions. No acute bony abnormality. IMPRESSION: No active cardiopulmonary disease. Electronically Signed   By: Charlett Nose M.D.   On: 11/11/2017 10:05    ____________________________________________   PROCEDURES  Procedure(s) performed: None  Procedures  Critical Care performed: No  ____________________________________________   INITIAL IMPRESSION / ASSESSMENT AND PLAN / ED COURSE Chest x-ray was reassuring and patient was made aware that her test were negative. Most likely she has a viral illness. She is to increase fluids and take Tylenol if needed for fever or body aches. She is also given a prescription for Zofran 1 every 8 hours as needed for nausea. She will follow-up with her PCP if any continued problems.  ____________________________________________   FINAL CLINICAL IMPRESSION(S) / ED DIAGNOSES  Final diagnoses:  Viral illness     ED Discharge Orders        Ordered    ondansetron (ZOFRAN ODT) 4 MG disintegrating tablet  Every 8 hours PRN     11/11/17 1107       Note:  This document was prepared using Dragon voice recognition software and may include unintentional dictation errors.    Tommi Rumps, PA-C 11/11/17 1242    Minna Antis, MD 11/11/17 [phone]

## 2017-11-11 NOTE — ED Notes (Signed)
Pt to ed with c/o sore throat, cough, congestion, and difficulty sleeping since Wednesday.  States each day she has felt worse.  Pt in no acute resp distress at this time.  Pt speaking in full complete sentences. Skin warm and dry.

## 2018-03-18 LAB — HM HIV SCREENING LAB: HM HIV Screening: NEGATIVE

## 2018-03-19 ENCOUNTER — Encounter: Payer: Self-pay | Admitting: Emergency Medicine

## 2018-03-19 ENCOUNTER — Emergency Department: Payer: Self-pay

## 2018-03-19 ENCOUNTER — Emergency Department
Admission: EM | Admit: 2018-03-19 | Discharge: 2018-03-19 | Disposition: A | Discharge status: Home / Self Care | Payer: Self-pay | Attending: Emergency Medicine | Admitting: Emergency Medicine

## 2018-03-19 DIAGNOSIS — Z79899 Other long term (current) drug therapy: Secondary | ICD-10-CM | POA: Insufficient documentation

## 2018-03-19 DIAGNOSIS — F17290 Nicotine dependence, other tobacco product, uncomplicated: Secondary | ICD-10-CM | POA: Insufficient documentation

## 2018-03-19 DIAGNOSIS — M25512 Pain in left shoulder: Secondary | ICD-10-CM | POA: Insufficient documentation

## 2018-03-19 MED ORDER — CYCLOBENZAPRINE HCL 5 MG PO TABS: ORAL_TABLET | ORAL | 0 refills | Status: DC

## 2018-03-19 MED ORDER — IBUPROFEN 800 MG PO TABS: 800.0000 mg | ORAL_TABLET | Freq: Three times a day (TID) | ORAL | 0 refills | Status: DC | PRN

## 2018-03-19 NOTE — ED Triage Notes (Signed)
Pt reports this am she felt her left shoulder pop and now it is painful to move.

## 2018-03-19 NOTE — ED Notes (Signed)
See triage note  She she felt pop in left shoulder this am  No deformity noted

## 2018-03-19 NOTE — ED Provider Notes (Signed)
[facility] Emergency Department Provider Note  ____________________________________________  Time seen: Approximately 2:05 PM  I have reviewed the triage vital signs and the nursing notes.   HISTORY  Chief Complaint Shoulder Pain    HPI Michelle Washington is a 22 y.o. female that presents to the emergency department for evaluation of left shoulder pain  for 1 day.  Patient states that her arm got stuck under her pillow this morning and she heard a pop.  It is painful to move her arm backwards.  No history of shoulder pain.  No numbness, tingling.  Past Medical History:  Diagnosis Date  . Anemia   . Back pain   . Costochondritis     There are no active problems to display for this patient.   Past Surgical History:  Procedure Laterality Date  . TONSILLECTOMY      Prior to Admission medications   Medication Sig Start Date End Date Taking? Authorizing Provider  cyclobenzaprine (FLEXERIL) 5 MG tablet Take 1-2 tablets 3 times daily as needed 03/19/18   Enid Derry, PA-C  dicyclomine (BENTYL) 10 MG capsule Take 1 capsule (10 mg total) by mouth 3 (three) times daily as needed for spasms. 06/26/17 07/10/17  Willy Eddy, MD  ibuprofen (ADVIL,MOTRIN) 800 MG tablet Take 1 tablet (800 mg total) by mouth every 8 (eight) hours as needed. 03/19/18   Enid Derry, PA-C  ondansetron (ZOFRAN ODT) 4 MG disintegrating tablet Take 1 tablet (4 mg total) by mouth every 8 (eight) hours as needed for nausea or vomiting. 11/11/17   Tommi Rumps, PA-C  polyethylene glycol (MIRALAX / GLYCOLAX) packet Take 17 g by mouth daily. Mix one tablespoon with 8oz of your favorite juice or water every day until you are having soft formed stools. Then start taking once daily if you didn't have a stool the day before. 02/15/17   Willy Eddy, MD  starch (ANUSOL) 51 % suppository Place 1 suppository rectally as needed for pain. 02/15/17   Willy Eddy, MD    Allergies Watermelon  [citrullus vulgaris]  No family history on file.  Social History Social History   Tobacco Use  . Smoking status: Current Some Day Smoker    Packs/day: 1.00    Types: Cigars  . Smokeless tobacco: Never Used  Substance Use Topics  . Alcohol use: No  . Drug use: No     Review of Systems  Constitutional: No fever/chills Cardiovascular: No chest pain. Respiratory: No cough.  Musculoskeletal: Positive for shoulder pain. Skin: Negative for rash, abrasions, lacerations, ecchymosis. Neurological: Negative for headaches, numbness or tingling   ____________________________________________   PHYSICAL EXAM:  VITAL SIGNS: ED Triage Vitals  Enc Vitals Group     BP 03/19/18 1230 127/77     Pulse Rate 03/19/18 1230 73     Resp 03/19/18 1230 20     Temp 03/19/18 1230 98.7 F (37.1 C)     Temp Source 03/19/18 1230 Oral     SpO2 03/19/18 1230 100 %     Weight 03/19/18 1227 216 lb (98 kg)     Height 03/19/18 1227  (1.676 m)     Head Circumference --      Peak Flow --      Pain Score 03/19/18 1227 10     Pain Loc --      Pain Edu? --      Excl. in GC? --      Constitutional: Alert and oriented. Well appearing and in no  acute distress. Eyes: Conjunctivae are normal. PERRL. EOMI. Head: Atraumatic. ENT:      Ears:      Nose: No congestion/rhinnorhea.      Mouth/Throat: Mucous membranes are moist.  Neck: No stridor.   Cardiovascular: Normal rate, regular rhythm.  Good peripheral circulation.  Symmetric radial pulses bilaterally. Respiratory: Normal respiratory effort without tachypnea or retractions. Lungs CTAB. Good air entry to the bases with no decreased or absent breath sounds. Musculoskeletal: Full range of motion to all extremities. No gross deformities appreciated.  Pain elicited with extension of left shoulder.  Tenderness to palpation over superior shoulder. Neurologic:  Normal speech and language. No gross focal neurologic deficits are appreciated.  Skin:  Skin  is warm, dry and intact. No rash noted. Psychiatric: Mood and affect are normal. Speech and behavior are normal. Patient exhibits appropriate insight and judgement.   ____________________________________________   LABS (all labs ordered are listed, but only abnormal results are displayed)  Labs Reviewed - No data to display ____________________________________________  EKG   ____________________________________________  RADIOLOGY Lexine Baton, personally viewed and evaluated these images (plain radiographs) as part of my medical decision making, as well as reviewing the written report by the radiologist.  Dg Shoulder Left  Result Date: 03/19/2018 CLINICAL DATA:  Shoulder pain following stretching, initial encounter EXAM: LEFT SHOULDER - 2+ VIEW COMPARISON:  10/04/2016 FINDINGS: There is no evidence of fracture or dislocation. There is no evidence of arthropathy or other focal bone abnormality. Soft tissues are unremarkable. IMPRESSION: No acute abnormality noted. Electronically Signed   By: Alcide Clever M.D.   On: 03/19/2018 13:09    ____________________________________________    PROCEDURES  Procedure(s) performed:    Procedures    Medications - No data to display   ____________________________________________   INITIAL IMPRESSION / ASSESSMENT AND PLAN / ED COURSE  Pertinent labs & imaging results that were available during my care of the patient were reviewed by me and considered in my medical decision making (see chart for details).  Review of the [place] CSRS was performed in accordance of the [facility] prior to dispensing any controlled drugs.   Patient presented to the emergency department for evaluation of shoulder pain for 1 day.  Vital signs and exam are reassuring.  Shoulder x-ray is negative.  Patient will be discharged home with prescriptions for ibuprofen and flexeril.  Sling was given. Patient is to follow up with PCP as directed. Patient is given ED  precautions to return to the ED for any worsening or new symptoms.     ____________________________________________  FINAL CLINICAL IMPRESSION(S) / ED DIAGNOSES  Final diagnoses:  Acute pain of left shoulder      NEW MEDICATIONS STARTED DURING THIS VISIT:  ED Discharge Orders        Ordered    ibuprofen (ADVIL,MOTRIN) 800 MG tablet  Every 8 hours PRN     03/19/18 1423    cyclobenzaprine (FLEXERIL) 5 MG tablet     03/19/18 1423          This chart was dictated using voice recognition software/Dragon. Despite best efforts to proofread, errors can occur which can change the meaning. Any change was purely unintentional.    Enid Derry, PA-C 03/19/18 1502    Emily Filbert, MD 03/19/18 [phone]

## 2018-07-28 ENCOUNTER — Emergency Department
Admission: EM | Admit: 2018-07-28 | Discharge: 2018-07-28 | Disposition: A | Discharge status: Home / Self Care | Payer: BLUE CROSS/BLUE SHIELD | Attending: Emergency Medicine | Admitting: Emergency Medicine

## 2018-07-28 ENCOUNTER — Other Ambulatory Visit: Payer: Self-pay

## 2018-07-28 ENCOUNTER — Encounter: Payer: Self-pay | Admitting: Emergency Medicine

## 2018-07-28 DIAGNOSIS — J029 Acute pharyngitis, unspecified: Secondary | ICD-10-CM | POA: Insufficient documentation

## 2018-07-28 DIAGNOSIS — F17290 Nicotine dependence, other tobacco product, uncomplicated: Secondary | ICD-10-CM | POA: Insufficient documentation

## 2018-07-28 DIAGNOSIS — R0981 Nasal congestion: Secondary | ICD-10-CM | POA: Insufficient documentation

## 2018-07-28 DIAGNOSIS — R070 Pain in throat: Secondary | ICD-10-CM | POA: Diagnosis present

## 2018-07-28 DIAGNOSIS — J3489 Other specified disorders of nose and nasal sinuses: Secondary | ICD-10-CM | POA: Insufficient documentation

## 2018-07-28 LAB — GROUP A STREP BY PCR: GROUP A STREP BY PCR: NOT DETECTED

## 2018-07-28 MED ORDER — FEXOFENADINE-PSEUDOEPHED ER 60-120 MG PO TB12: 1.0000 | ORAL_TABLET | Freq: Two times a day (BID) | ORAL | 0 refills | Status: DC

## 2018-07-28 MED ORDER — LIDOCAINE VISCOUS HCL 2 % MT SOLN: 5.0000 mL | Freq: Four times a day (QID) | OROMUCOSAL | 0 refills | Status: DC | PRN

## 2018-07-28 NOTE — ED Notes (Signed)
Pt verbalizes understanding of d/c instructions, medications and follow up 

## 2018-07-28 NOTE — ED Provider Notes (Signed)
[facility] Emergency Department Provider Note   ____________________________________________   First MD Initiated Contact with Patient 07/28/18 1318     (approximate)  I have reviewed the triage vital signs and the nursing notes.   HISTORY  Chief Complaint Sore Throat and sinus congestion    HPI Michelle Washington is a 22 y.o. female patient presents with sore throat and sinus congestion for 3 days.  Patient states she is able to tolerate fluid and food with discomfort.  Patient intermittent nasal congestion with rhinorrhea.  Patient denies nausea, vomiting, diarrhea.  Patient rates her pain discomfort as a 5/10.  Patient described the pain is "sore".  No palliative measures for complaint.  Patient is status post tonsillectomy. Past Medical History:  Diagnosis Date  . Anemia   . Back pain   . Costochondritis     There are no active problems to display for this patient.   Past Surgical History:  Procedure Laterality Date  . TONSILLECTOMY      Prior to Admission medications   Medication Sig Start Date End Date Taking? Authorizing Provider  cyclobenzaprine (FLEXERIL) 5 MG tablet Take 1-2 tablets 3 times daily as needed 03/19/18   Enid Derry, PA-C  dicyclomine (BENTYL) 10 MG capsule Take 1 capsule (10 mg total) by mouth 3 (three) times daily as needed for spasms. 06/26/17 07/10/17  Willy Eddy, MD  fexofenadine-pseudoephedrine (ALLEGRA-D) 60-120 MG 12 hr tablet Take 1 tablet by mouth 2 (two) times daily. 07/28/18   Joni Reining, PA-C  ibuprofen (ADVIL,MOTRIN) 800 MG tablet Take 1 tablet (800 mg total) by mouth every 8 (eight) hours as needed. 03/19/18   Enid Derry, PA-C  lidocaine (XYLOCAINE) 2 % solution Use as directed 5 mLs in the mouth or throat every 6 (six) hours as needed for mouth pain. For oral swish and swallow. 07/28/18   Joni Reining, PA-C  ondansetron (ZOFRAN ODT) 4 MG disintegrating tablet Take 1 tablet (4 mg total) by mouth  every 8 (eight) hours as needed for nausea or vomiting. 11/11/17   Tommi Rumps, PA-C  polyethylene glycol (MIRALAX / GLYCOLAX) packet Take 17 g by mouth daily. Mix one tablespoon with 8oz of your favorite juice or water every day until you are having soft formed stools. Then start taking once daily if you didn't have a stool the day before. 02/15/17   Willy Eddy, MD  starch (ANUSOL) 51 % suppository Place 1 suppository rectally as needed for pain. 02/15/17   Willy Eddy, MD    Allergies Watermelon [citrullus vulgaris]  No family history on file.  Social History Social History   Tobacco Use  . Smoking status: Current Some Day Smoker    Packs/day: 1.00    Types: Cigars  . Smokeless tobacco: Never Used  Substance Use Topics  . Alcohol use: No  . Drug use: No    Review of Systems Constitutional: No fever/chills Eyes: No visual changes. ENT: Nasal congestion and sore throat. Cardiovascular: Denies chest pain. Respiratory: Denies shortness of breath. Gastrointestinal: No abdominal pain.  No nausea, no vomiting.  No diarrhea.  No constipation. Genitourinary: Negative for dysuria. Musculoskeletal: Negative for back pain. Skin: Negative for rash. Neurological: Negative for headaches, focal weakness or numbness. Allergic/Immunilogical: Watermelon.  ____________________________________________   PHYSICAL EXAM:  VITAL SIGNS: ED Triage Vitals  Enc Vitals Group     BP --      Pulse --      Resp --      Temp --  Temp src --      SpO2 --      Weight 07/28/18 1307 216 lb 0.8 oz (98 kg)     Height --      Head Circumference --      Peak Flow --      Pain Score 07/28/18 1306 5     Pain Loc --      Pain Edu? --      Excl. in GC? --    Constitutional: Alert and oriented. Well appearing and in no acute distress. Nose: Edematous nasal turbinates clear rhinorrhea. Mouth/Throat: Mucous membranes are moist.  Oropharynx non-erythematous.  Postnasal  drainage. Neck: No stridor.  Hematological/Lymphatic/Immunilogical: No cervical lymphadenopathy. Cardiovascular: Normal rate, regular rhythm. Grossly normal heart sounds.  Good peripheral circulation. Respiratory: Normal respiratory effort.  No retractions. Lungs CTAB. Skin:  Skin is warm, dry and intact. No rash noted. Psychiatric: Mood and affect are normal. Speech and behavior are normal.  ____________________________________________   LABS (all labs ordered are listed, but only abnormal results are displayed)  Labs Reviewed  GROUP A STREP BY PCR   ____________________________________________  EKG   ____________________________________________  RADIOLOGY  ED MD interpretation:    Official radiology report(s): No results found.  ____________________________________________   PROCEDURES  Procedure(s) performed: None  Procedures  Critical Care performed: No  ____________________________________________   INITIAL IMPRESSION / ASSESSMENT AND PLAN / ED COURSE  As part of my medical decision making, I reviewed the following data within the electronic MEDICAL RECORD NUMBER    Sore throat secondary to postnasal drainage.  Discussed negative strep results with patient.  Patient given discharge care instruction advised take medication as directed.  [facility] if condition persist.      ____________________________________________   FINAL CLINICAL IMPRESSION(S) / ED DIAGNOSES  Final diagnoses:  Viral pharyngitis     ED Discharge Orders         Ordered    fexofenadine-pseudoephedrine (ALLEGRA-D) 60-120 MG 12 hr tablet  2 times daily     07/28/18 1423    lidocaine (XYLOCAINE) 2 % solution  Every 6 hours PRN     07/28/18 1423           Note:  This document was prepared using Dragon voice recognition software and may include unintentional dictation errors.    Joni ReiningSmith, Ronald K, PA-C 07/28/18 1426    Emily FilbertWilliams,  Jonathan E, MD 07/28/18 1501

## 2018-07-28 NOTE — ED Notes (Signed)
Pt reports sore throat and nasal congestion for several days. States painful to swallow.

## 2018-07-28 NOTE — ED Triage Notes (Signed)
C/O sore throat and sinus congestion x 3 days.

## 2018-07-31 ENCOUNTER — Other Ambulatory Visit: Payer: Self-pay

## 2018-07-31 ENCOUNTER — Encounter: Payer: Self-pay | Admitting: Emergency Medicine

## 2018-07-31 ENCOUNTER — Emergency Department
Admission: EM | Admit: 2018-07-31 | Discharge: 2018-07-31 | Disposition: A | Discharge status: Home / Self Care | Payer: BLUE CROSS/BLUE SHIELD | Attending: Emergency Medicine | Admitting: Emergency Medicine

## 2018-07-31 DIAGNOSIS — J069 Acute upper respiratory infection, unspecified: Secondary | ICD-10-CM | POA: Diagnosis not present

## 2018-07-31 DIAGNOSIS — R0981 Nasal congestion: Secondary | ICD-10-CM | POA: Diagnosis present

## 2018-07-31 DIAGNOSIS — F17290 Nicotine dependence, other tobacco product, uncomplicated: Secondary | ICD-10-CM | POA: Diagnosis not present

## 2018-07-31 DIAGNOSIS — Z79899 Other long term (current) drug therapy: Secondary | ICD-10-CM | POA: Insufficient documentation

## 2018-07-31 MED ORDER — FLUTICASONE PROPIONATE 50 MCG/ACT NA SUSP: 2.0000 | Freq: Every day | NASAL | 0 refills | Status: DC

## 2018-07-31 MED ORDER — AZITHROMYCIN 250 MG PO TABS: ORAL_TABLET | ORAL | 0 refills | Status: DC

## 2018-07-31 NOTE — ED Triage Notes (Signed)
Pt comes into the ED via POV c/o sore throat.  Patient has pharyngitis but she states that it is getting any better, and she just wants to be rechecked.  Patient in NAD at this time.

## 2018-07-31 NOTE — ED Provider Notes (Signed)
[facility] Emergency Department Provider Note  ____________________________________________  Time seen: Approximately 3:15 PM  I have reviewed the triage vital signs and the nursing notes.   HISTORY  Chief Complaint Sore Throat    HPI Michelle Washington is a 22 y.o. female presents emergency department for evaluation of nasal congestion and sore throat for 6 days and nonproductive cough for 3 days.  Patient states that she was evaluated in the emergency department on Monday and was told that she has viral pharyngitis.  She states that symptoms have increasingly worse send for the past 3 days.  She has felt chills but has not checked her temperature.   Past Medical History:  Diagnosis Date  . Anemia   . Back pain   . Costochondritis     There are no active problems to display for this patient.   Past Surgical History:  Procedure Laterality Date  . TONSILLECTOMY      Prior to Admission medications   Medication Sig Start Date End Date Taking? Authorizing Provider  azithromycin (ZITHROMAX Z-PAK) 250 MG tablet Take 2 tablets (500 mg) on  Day 1,  followed by 1 tablet (250 mg) once daily on Days 2 through 5. 07/31/18   Enid Derry, PA-C  cyclobenzaprine (FLEXERIL) 5 MG tablet Take 1-2 tablets 3 times daily as needed 03/19/18   Enid Derry, PA-C  dicyclomine (BENTYL) 10 MG capsule Take 1 capsule (10 mg total) by mouth 3 (three) times daily as needed for spasms. 06/26/17 07/10/17  Willy Eddy, MD  fexofenadine-pseudoephedrine (ALLEGRA-D) 60-120 MG 12 hr tablet Take 1 tablet by mouth 2 (two) times daily. 07/28/18   Joni Reining, PA-C  fluticasone (FLONASE) 50 MCG/ACT nasal spray Place 2 sprays into both nostrils daily. 07/31/18 07/31/19  Enid Derry, PA-C  ibuprofen (ADVIL,MOTRIN) 800 MG tablet Take 1 tablet (800 mg total) by mouth every 8 (eight) hours as needed. 03/19/18   Enid Derry, PA-C  lidocaine (XYLOCAINE) 2 % solution Use as directed 5 mLs in  the mouth or throat every 6 (six) hours as needed for mouth pain. For oral swish and swallow. 07/28/18   Joni Reining, PA-C  ondansetron (ZOFRAN ODT) 4 MG disintegrating tablet Take 1 tablet (4 mg total) by mouth every 8 (eight) hours as needed for nausea or vomiting. 11/11/17   Tommi Rumps, PA-C  polyethylene glycol (MIRALAX / GLYCOLAX) packet Take 17 g by mouth daily. Mix one tablespoon with 8oz of your favorite juice or water every day until you are having soft formed stools. Then start taking once daily if you didn't have a stool the day before. 02/15/17   Willy Eddy, MD  starch (ANUSOL) 51 % suppository Place 1 suppository rectally as needed for pain. 02/15/17   Willy Eddy, MD    Allergies Watermelon [citrullus vulgaris]  No family history on file.  Social History Social History   Tobacco Use  . Smoking status: Current Some Day Smoker    Packs/day: 1.00    Types: Cigars  . Smokeless tobacco: Never Used  Substance Use Topics  . Alcohol use: No  . Drug use: No     Review of Systems  Constitutional: Positive for chills Eyes: No visual changes. No discharge. ENT: Positive for congestion and rhinorrhea. Cardiovascular: No chest pain. Respiratory: Positive for cough. No SOB. Gastrointestinal: No abdominal pain.  No nausea, no vomiting.  No diarrhea.  No constipation. Musculoskeletal: Negative for musculoskeletal pain. Skin: Negative for rash, abrasions, lacerations, ecchymosis. Neurological: Negative  for headaches.   ____________________________________________   PHYSICAL EXAM:  VITAL SIGNS: ED Triage Vitals  Enc Vitals Group     BP 07/31/18 1352 107/61     Pulse Rate 07/31/18 1352 100     Resp 07/31/18 1352 14     Temp 07/31/18 1352 98.7 F (37.1 C)     Temp Source 07/31/18 1352 Oral     SpO2 07/31/18 1352 100 %     Weight 07/31/18 1352 200 lb (90.7 kg)     Height 07/31/18 1352 5\' 6"  (1.676 m)     Head Circumference --      Peak Flow --       Pain Score 07/31/18 1400 10     Pain Loc --      Pain Edu? --      Excl. in GC? --      Constitutional: Alert and oriented. Well appearing and in no acute distress. Eyes: Conjunctivae are normal. PERRL. EOMI. No discharge. Head: Atraumatic. ENT: No frontal and maxillary sinus tenderness.      Ears: Tympanic membranes pearly gray with good landmarks. No discharge.      Nose: Mild congestion/rhinnorhea.      Mouth/Throat: Mucous membranes are moist. Oropharynx non-erythematous. Tonsils not enlarged. No exudates. Uvula midline. Neck: No stridor.   Hematological/Lymphatic/Immunilogical: No cervical lymphadenopathy. Cardiovascular: Normal rate, regular rhythm.  Good peripheral circulation. Respiratory: Normal respiratory effort without tachypnea or retractions. Lungs CTAB. Good air entry to the bases with no decreased or absent breath sounds. Gastrointestinal: Bowel sounds 4 quadrants. Soft and nontender to palpation. No guarding or rigidity. No palpable masses. No distention. Musculoskeletal: Full range of motion to all extremities. No gross deformities appreciated. Neurologic:  Normal speech and language. No gross focal neurologic deficits are appreciated.  Skin:  Skin is warm, dry and intact. No rash noted. Psychiatric: Mood and affect are normal. Speech and behavior are normal. Patient exhibits appropriate insight and judgement.   ____________________________________________   LABS (all labs ordered are listed, but only abnormal results are displayed)  Labs Reviewed - No data to display ____________________________________________  EKG   ____________________________________________  RADIOLOGY   No results found.  ____________________________________________    PROCEDURES  Procedure(s) performed:    Procedures    Medications - No data to display   ____________________________________________   INITIAL IMPRESSION / ASSESSMENT AND PLAN / ED  COURSE  Pertinent labs & imaging results that were available during my care of the patient were reviewed by me and considered in my medical decision making (see chart for details).  Review of the  CSRS was performed in accordance of the [facility] prior to dispensing any controlled drugs.     Patient's diagnosis is consistent with URI. Vital signs and exam are reassuring. Patient feels comfortable going home. Patient will be discharged home with prescriptions for azithromycin and Flonase. Patient is to follow up with primary care as needed or otherwise directed. Patient is given ED precautions to return to the ED for any worsening or new symptoms.     ____________________________________________  FINAL CLINICAL IMPRESSION(S) / ED DIAGNOSES  Final diagnoses:  Upper respiratory tract infection, unspecified type      NEW MEDICATIONS STARTED DURING THIS VISIT:  ED Discharge Orders         Ordered    azithromycin (ZITHROMAX Z-PAK) 250 MG tablet     07/31/18 1459    fluticasone (FLONASE) 50 MCG/ACT nasal spray  Daily     07/31/18 1459  This chart was dictated using voice recognition software/Dragon. Despite best efforts to proofread, errors can occur which can change the meaning. Any change was purely unintentional.    Enid Derry, PA-C 07/31/18 1517    Governor Rooks, MD 07/31/18 [phone]

## 2018-10-13 ENCOUNTER — Emergency Department
Admission: EM | Admit: 2018-10-13 | Discharge: 2018-10-13 | Disposition: A | Discharge status: Home / Self Care | Payer: BLUE CROSS/BLUE SHIELD | Attending: Emergency Medicine | Admitting: Emergency Medicine

## 2018-10-13 ENCOUNTER — Encounter: Payer: Self-pay | Admitting: Emergency Medicine

## 2018-10-13 ENCOUNTER — Other Ambulatory Visit: Payer: Self-pay

## 2018-10-13 DIAGNOSIS — R102 Pelvic and perineal pain: Secondary | ICD-10-CM | POA: Insufficient documentation

## 2018-10-13 DIAGNOSIS — F17210 Nicotine dependence, cigarettes, uncomplicated: Secondary | ICD-10-CM | POA: Insufficient documentation

## 2018-10-13 LAB — URINALYSIS, COMPLETE (UACMP) WITH MICROSCOPIC
Bacteria, UA: NONE SEEN
Bilirubin Urine: NEGATIVE
GLUCOSE, UA: NEGATIVE mg/dL
HGB URINE DIPSTICK: NEGATIVE
KETONES UR: NEGATIVE mg/dL
Nitrite: NEGATIVE
Protein, ur: NEGATIVE mg/dL
Specific Gravity, Urine: 1.024 (ref 1.005–1.030)
pH: 5 (ref 5.0–8.0)

## 2018-10-13 LAB — COMPREHENSIVE METABOLIC PANEL
ALK PHOS: 37 U/L — AB (ref 38–126)
ALT: 8 U/L (ref 0–44)
ANION GAP: 4 — AB (ref 5–15)
AST: 16 U/L (ref 15–41)
Albumin: 4.2 g/dL (ref 3.5–5.0)
BILIRUBIN TOTAL: 0.4 mg/dL (ref 0.3–1.2)
BUN: 6 mg/dL (ref 6–20)
CALCIUM: 9 mg/dL (ref 8.9–10.3)
CO2: 28 mmol/L (ref 22–32)
CREATININE: 0.73 mg/dL (ref 0.44–1.00)
Chloride: 107 mmol/L (ref 98–111)
GFR calc Af Amer: >60 mL/min (ref >60)
GFR calc non Af Amer: >60 mL/min (ref >60)
GLUCOSE: 93 mg/dL (ref 70–99)
Potassium: 3.7 mmol/L (ref 3.5–5.1)
Sodium: 139 mmol/L (ref 135–145)
TOTAL PROTEIN: 7 g/dL (ref 6.5–8.1)

## 2018-10-13 LAB — WET PREP, GENITAL
CLUE CELLS WET PREP: NONE SEEN
Sperm: NONE SEEN
Trich, Wet Prep: NONE SEEN
Yeast Wet Prep HPF POC: NONE SEEN

## 2018-10-13 LAB — CBC
HCT: 41.6 % (ref 36.0–46.0)
HEMOGLOBIN: 12.7 g/dL (ref 12.0–15.0)
MCH: 26.1 pg (ref 26.0–34.0)
MCHC: 30.5 g/dL (ref 30.0–36.0)
MCV: 85.6 fL (ref 80.0–100.0)
Platelets: 174 10*3/uL (ref 150–400)
RBC: 4.86 MIL/uL (ref 3.87–5.11)
RDW: 13.2 % (ref 11.5–15.5)
WBC: 9.1 10*3/uL (ref 4.0–10.5)
nRBC: 0 % (ref 0.0–0.2)

## 2018-10-13 LAB — CHLAMYDIA/NGC RT PCR (ARMC ONLY)
CHLAMYDIA TR: NOT DETECTED
N gonorrhoeae: NOT DETECTED

## 2018-10-13 LAB — POCT PREGNANCY, URINE: Preg Test, Ur: NEGATIVE

## 2018-10-13 LAB — LIPASE, BLOOD: Lipase: 29 U/L (ref 11–51)

## 2018-10-13 NOTE — ED Triage Notes (Signed)
Pt here for for pelvic cramping. Had light spotting last month instead of normal period. Had a positive and negative home test.  Negative urine test here.  Ambulatory. VSS

## 2018-10-13 NOTE — ED Notes (Signed)
Pt reporting she has to leave to pick up family. Pt able to stay for pelvic exam and will wait for RN to call with results. Number in computer is up to date and correct.

## 2018-10-13 NOTE — ED Provider Notes (Signed)
[facility] Emergency Department Provider Note   ____________________________________________    I have reviewed the triage vital signs and the nursing notes.   HISTORY  Chief Complaint pelvic cramping     HPI Michelle Washington is a 22 y.o. female who presents with lower abdominal/pelvic cramping which is been occurring over the last 2 days.  She reports the pain is mild to moderate.  She denies dysuria.  No fevers or chills.  No vaginal discharge.  No recent new sexual partners.  Has not taken anything for this.  No radiation of pain   Past Medical History:  Diagnosis Date  . Anemia   . Back pain   . Costochondritis     There are no active problems to display for this patient.   Past Surgical History:  Procedure Laterality Date  . TONSILLECTOMY      Prior to Admission medications   Medication Sig Start Date End Date Taking? Authorizing Provider  azithromycin (ZITHROMAX Z-PAK) 250 MG tablet Take 2 tablets (500 mg) on  Day 1,  followed by 1 tablet (250 mg) once daily on Days 2 through 5. 07/31/18   Enid DerryWagner, Ashley, PA-C  cyclobenzaprine (FLEXERIL) 5 MG tablet Take 1-2 tablets 3 times daily as needed 03/19/18   Enid DerryWagner, Ashley, PA-C  dicyclomine (BENTYL) 10 MG capsule Take 1 capsule (10 mg total) by mouth 3 (three) times daily as needed for spasms. 06/26/17 07/10/17  Willy Eddyobinson, Patrick, MD  fexofenadine-pseudoephedrine (ALLEGRA-D) 60-120 MG 12 hr tablet Take 1 tablet by mouth 2 (two) times daily. 07/28/18   Joni ReiningSmith, Ronald K, PA-C  fluticasone (FLONASE) 50 MCG/ACT nasal spray Place 2 sprays into both nostrils daily. 07/31/18 07/31/19  Enid DerryWagner, Ashley, PA-C  ibuprofen (ADVIL,MOTRIN) 800 MG tablet Take 1 tablet (800 mg total) by mouth every 8 (eight) hours as needed. 03/19/18   Enid DerryWagner, Ashley, PA-C  lidocaine (XYLOCAINE) 2 % solution Use as directed 5 mLs in the mouth or throat every 6 (six) hours as needed for mouth pain. For oral swish and swallow. 07/28/18    Joni ReiningSmith, Ronald K, PA-C  ondansetron (ZOFRAN ODT) 4 MG disintegrating tablet Take 1 tablet (4 mg total) by mouth every 8 (eight) hours as needed for nausea or vomiting. 11/11/17   Tommi RumpsSummers, Rhonda L, PA-C  polyethylene glycol (MIRALAX / GLYCOLAX) packet Take 17 g by mouth daily. Mix one tablespoon with 8oz of your favorite juice or water every day until you are having soft formed stools. Then start taking once daily if you didn't have a stool the day before. 02/15/17   Willy Eddyobinson, Patrick, MD  starch (ANUSOL) 51 % suppository Place 1 suppository rectally as needed for pain. 02/15/17   Willy Eddyobinson, Patrick, MD     Allergies Watermelon [citrullus vulgaris]  History reviewed. No pertinent family history.  Social History Social History   Tobacco Use  . Smoking status: Current Some Day Smoker    Packs/day: 1.00    Types: Cigars  . Smokeless tobacco: Never Used  Substance Use Topics  . Alcohol use: No  . Drug use: No    Review of Systems  Constitutional: No fever/chills Eyes: No visual changes.  ENT: No sore throat. Cardiovascular: Denies chest pain. Respiratory: Denies shortness of breath. Gastrointestinal: As above Genitourinary: As above Musculoskeletal: Negative for back pain. Skin: Negative for rash. Neurological: Negative for headaches    ____________________________________________   PHYSICAL EXAM:  VITAL SIGNS: ED Triage Vitals  Enc Vitals Group     BP 10/13/18 1748  134/86     Pulse Rate 10/13/18 1748 86     Resp 10/13/18 1748 20     Temp 10/13/18 1748 98.5 F (36.9 C)     Temp Source 10/13/18 1748 Oral     SpO2 10/13/18 1748 100 %     Weight 10/13/18 1749 86.2 kg (190 lb)     Height 10/13/18 1749 1.676 m (5\' 6" )     Head Circumference --      Peak Flow --      Pain Score 10/13/18 1804 10     Pain Loc --      Pain Edu? --      Excl. in GC? --     Constitutional: Alert and oriented. No acute distress. Pleasant and interactive Eyes: Conjunctivae are normal.    Nose: No congestion/rhinnorhea. Mouth/Throat: Mucous membranes are moist.   Neck:  Painless ROM Cardiovascular: Normal rate, regular rhythm. Grossly normal heart sounds.  Good peripheral circulation. Respiratory: Normal respiratory effort.  No retractions. Lungs CTAB. Gastrointestinal: Soft and nontender. No distention.  No CVA tenderness. Genitourinary: No cervicitis, whitish discharge Musculoskeletal:   Warm and well perfused Neurologic:  Normal speech and language. No gross focal neurologic deficits are appreciated.  Skin:  Skin is warm, dry and intact. No rash noted. Psychiatric: Mood and affect are normal. Speech and behavior are normal.  ____________________________________________   LABS (all labs ordered are listed, but only abnormal results are displayed)  Labs Reviewed  WET PREP, GENITAL - Abnormal; Notable for the following components:      Result Value   WBC, Wet Prep HPF POC FEW (*)    All other components within normal limits  COMPREHENSIVE METABOLIC PANEL - Abnormal; Notable for the following components:   Alkaline Phosphatase 37 (*)    Anion gap 4 (*)    All other components within normal limits  URINALYSIS, COMPLETE (UACMP) WITH MICROSCOPIC - Abnormal; Notable for the following components:   Color, Urine YELLOW (*)    APPearance CLEAR (*)    Leukocytes, UA SMALL (*)    All other components within normal limits  CHLAMYDIA/NGC RT PCR ([facility] ONLY)  LIPASE, BLOOD  CBC  POCT PREGNANCY, URINE  POC URINE PREG, ED   ____________________________________________  EKG  None ____________________________________________  RADIOLOGY  None ____________________________________________   PROCEDURES  Procedure(s) performed: No  Procedures   Critical Care performed: No ____________________________________________   INITIAL IMPRESSION / ASSESSMENT AND PLAN / ED COURSE  Pertinent labs & imaging results that were available during my care of the  patient were reviewed by me and considered in my medical decision making (see chart for details).  Patient presents with pelvic cramping which is mild to moderate.  Lab work is overall reassuring, urinalysis unremarkable.  GYN exam performed and wet prep sent however the patient reports that she has to pick up her niece and she would like to be called if any abnormal results.  Discussed with patient that ultrasound may be necessary but she reports she does not have time.  Return precautions discussed    ____________________________________________   FINAL CLINICAL IMPRESSION(S) / ED DIAGNOSES  Final diagnoses:  Pelvic pain        Note:  This document was prepared using Dragon voice recognition software and may include unintentional dictation errors.    Jene Every, MD 10/13/18 2225

## 2018-10-13 NOTE — ED Notes (Signed)
Pt undressed and pelvic cart at bedside 

## 2018-10-13 NOTE — ED Notes (Signed)
RN called but sent to voicemail. Pt updated via voicemail after pt confirmed that would be okay.

## 2018-12-15 ENCOUNTER — Encounter: Payer: Self-pay | Admitting: Emergency Medicine

## 2018-12-15 ENCOUNTER — Emergency Department
Admission: EM | Admit: 2018-12-15 | Discharge: 2018-12-15 | Disposition: A | Discharge status: Home / Self Care | Payer: Self-pay | Attending: Emergency Medicine | Admitting: Emergency Medicine

## 2018-12-15 ENCOUNTER — Other Ambulatory Visit: Payer: Self-pay

## 2018-12-15 DIAGNOSIS — R1084 Generalized abdominal pain: Secondary | ICD-10-CM | POA: Insufficient documentation

## 2018-12-15 DIAGNOSIS — F17210 Nicotine dependence, cigarettes, uncomplicated: Secondary | ICD-10-CM | POA: Insufficient documentation

## 2018-12-15 DIAGNOSIS — K529 Noninfective gastroenteritis and colitis, unspecified: Secondary | ICD-10-CM | POA: Insufficient documentation

## 2018-12-15 MED ORDER — ONDANSETRON 4 MG PO TBDP: 4.0000 mg | ORAL_TABLET | Freq: Three times a day (TID) | ORAL | 0 refills | Status: DC | PRN

## 2018-12-15 NOTE — ED Notes (Signed)
Pt is AOx4, vss, she denies any pain, nausea or vomiting at this time. We will continue to monitor the pt.

## 2018-12-15 NOTE — ED Triage Notes (Signed)
Pt c/o sore throat, cough, sneezing, and vomiting.  Cannot keep anything down.  Has had some diarrhea.  No abdominal pain.  Still feels like has to throw up.  Cannot keep liquids down per pt. Everyone in house has been sick. Unsure if pregnant last period in December.

## 2018-12-15 NOTE — ED Provider Notes (Signed)
[facility] Emergency Department Provider Note   ____________________________________________    I have reviewed the triage vital signs and the nursing notes.   HISTORY  Chief Complaint flu like symptoms    HPI Michelle Washington is a 23 y.o. female who presents with complaints of nausea vomiting abdominal cramping and loose stools.  Patient reports over the last several days she has had mild upper respiratory symptoms however last night developed nausea vomiting abdominal cramping and diarrhea as well as some myalgias.  No fevers or chills.  No recent travel.  Has not take anything for this.  Otherwise reports that she feels improved this morning has been able to tolerate [employer]   Past Medical History:  Diagnosis Date  . Anemia   . Back pain   . Costochondritis     There are no active problems to display for this patient.   Past Surgical History:  Procedure Laterality Date  . TONSILLECTOMY      Prior to Admission medications   Medication Sig Start Date End Date Taking? Authorizing Provider  azithromycin (ZITHROMAX Z-PAK) 250 MG tablet Take 2 tablets (500 mg) on  Day 1,  followed by 1 tablet (250 mg) once daily on Days 2 through 5. 07/31/18   Enid Derry, PA-C  cyclobenzaprine (FLEXERIL) 5 MG tablet Take 1-2 tablets 3 times daily as needed 03/19/18   Enid Derry, PA-C  dicyclomine (BENTYL) 10 MG capsule Take 1 capsule (10 mg total) by mouth 3 (three) times daily as needed for spasms. 06/26/17 07/10/17  Willy Eddy, MD  fexofenadine-pseudoephedrine (ALLEGRA-D) 60-120 MG 12 hr tablet Take 1 tablet by mouth 2 (two) times daily. 07/28/18   Joni Reining, PA-C  fluticasone (FLONASE) 50 MCG/ACT nasal spray Place 2 sprays into both nostrils daily. 07/31/18 07/31/19  Enid Derry, PA-C  ibuprofen (ADVIL,MOTRIN) 800 MG tablet Take 1 tablet (800 mg total) by mouth every 8 (eight) hours as needed. 03/19/18   Enid Derry, PA-C  lidocaine (XYLOCAINE) 2  % solution Use as directed 5 mLs in the mouth or throat every 6 (six) hours as needed for mouth pain. For oral swish and swallow. 07/28/18   Joni Reining, PA-C  ondansetron (ZOFRAN ODT) 4 MG disintegrating tablet Take 1 tablet (4 mg total) by mouth every 8 (eight) hours as needed for nausea or vomiting. 12/15/18   Jene Every, MD  polyethylene glycol (MIRALAX / Ethelene Hal) packet Take 17 g by mouth daily. Mix one tablespoon with 8oz of your favorite juice or water every day until you are having soft formed stools. Then start taking once daily if you didn't have a stool the day before. 02/15/17   Willy Eddy, MD  starch (ANUSOL) 51 % suppository Place 1 suppository rectally as needed for pain. 02/15/17   Willy Eddy, MD     Allergies Watermelon [citrullus vulgaris]  History reviewed. No pertinent family history.  Social History Social History   Tobacco Use  . Smoking status: Current Some Day Smoker    Packs/day: 1.00    Types: Cigars  . Smokeless tobacco: Never Used  Substance Use Topics  . Alcohol use: No  . Drug use: No    Review of Systems  Constitutional: No fever/chills  ENT: No sore throat.   Gastrointestinal: As above Genitourinary: Negative for dysuria. Musculoskeletal: As above Skin: Negative for rash. Neurological: Negative for headaches     ____________________________________________   PHYSICAL EXAM:  VITAL SIGNS: ED Triage Vitals  Enc Vitals Group  BP 12/15/18 0801 107/64     Pulse Rate 12/15/18 0801 89     Resp 12/15/18 0801 16     Temp 12/15/18 0801 97.7 F (36.5 C)     Temp Source 12/15/18 0801 Oral     SpO2 12/15/18 0801 96 %     Weight 12/15/18 0803 90.7 kg (200 lb)     Height 12/15/18 0803 1.676 m (5\' 6" )     Head Circumference --      Peak Flow --      Pain Score 12/15/18 0802 0     Pain Loc --      Pain Edu? --      Excl. in GC? --      Constitutional: Alert and oriented. No acute distress. Pleasant and  interactive Eyes: Conjunctivae are normal.  Nose: No congestion/rhinnorhea. Mouth/Throat: Mucous membranes are moist.   Cardiovascular: Normal rate, regular rhythm.  Respiratory: Normal respiratory effort.  No retractions. Abdomen: No abdominal tenderness to palpation  Musculoskeletal: No lower extremity tenderness nor edema.   Neurologic:  Normal speech and language. No gross focal neurologic deficits are appreciated.   Skin:  Skin is warm, dry and intact. No rash noted.   ____________________________________________   LABS (all labs ordered are listed, but only abnormal results are displayed)  Labs Reviewed - No data to display ____________________________________________  EKG   ____________________________________________  RADIOLOGY  None ____________________________________________   PROCEDURES  Procedure(s) performed: No  Procedures   Critical Care performed: No ____________________________________________   INITIAL IMPRESSION / ASSESSMENT AND PLAN / ED COURSE  Pertinent labs & imaging results that were available during my care of the patient were reviewed by me and considered in my medical decision making (see chart for details).  Patient presents with nausea vomiting diarrhea suspicious for viral gastroenteritis versus foodborne illness.  Overall quite well-appearing with normal vitals, unremarkable exam, no evidence of dehydration.  Will treat with p.o. Zofran, outpatient follow-up as needed.   ____________________________________________   FINAL CLINICAL IMPRESSION(S) / ED DIAGNOSES  Final diagnoses:  Gastroenteritis      NEW MEDICATIONS STARTED DURING THIS VISIT:  New Prescriptions   ONDANSETRON (ZOFRAN ODT) 4 MG DISINTEGRATING TABLET    Take 1 tablet (4 mg total) by mouth every 8 (eight) hours as needed for nausea or vomiting.     Note:  This document was prepared using Dragon voice recognition software and may include unintentional  dictation errors.   Jene Every, MD 12/15/18 [phone]

## 2018-12-15 NOTE — ED Notes (Signed)
Pt is being discharged to home. Aox4, VSS, pt does not c/o any pain at this time. AVS/RX was given and explained to the pt and they verbalized understanding of all information.   

## 2019-06-14 ENCOUNTER — Other Ambulatory Visit: Payer: Self-pay

## 2019-06-14 ENCOUNTER — Encounter: Payer: Self-pay | Admitting: Emergency Medicine

## 2019-06-14 ENCOUNTER — Emergency Department
Admission: EM | Admit: 2019-06-14 | Discharge: 2019-06-14 | Disposition: A | Discharge status: Home / Self Care | Payer: Self-pay | Attending: Emergency Medicine | Admitting: Emergency Medicine

## 2019-06-14 DIAGNOSIS — Z79899 Other long term (current) drug therapy: Secondary | ICD-10-CM | POA: Insufficient documentation

## 2019-06-14 DIAGNOSIS — N760 Acute vaginitis: Secondary | ICD-10-CM | POA: Insufficient documentation

## 2019-06-14 DIAGNOSIS — B9689 Other specified bacterial agents as the cause of diseases classified elsewhere: Secondary | ICD-10-CM

## 2019-06-14 DIAGNOSIS — A549 Gonococcal infection, unspecified: Secondary | ICD-10-CM | POA: Insufficient documentation

## 2019-06-14 DIAGNOSIS — F17290 Nicotine dependence, other tobacco product, uncomplicated: Secondary | ICD-10-CM | POA: Insufficient documentation

## 2019-06-14 DIAGNOSIS — Z202 Contact with and (suspected) exposure to infections with a predominantly sexual mode of transmission: Secondary | ICD-10-CM | POA: Insufficient documentation

## 2019-06-14 DIAGNOSIS — Z711 Person with feared health complaint in whom no diagnosis is made: Secondary | ICD-10-CM

## 2019-06-14 LAB — CHLAMYDIA/NGC RT PCR (ARMC ONLY)
Chlamydia Tr: NOT DETECTED
N gonorrhoeae: DETECTED — AB

## 2019-06-14 LAB — URINALYSIS, COMPLETE (UACMP) WITH MICROSCOPIC
Bacteria, UA: NONE SEEN
Bilirubin Urine: NEGATIVE
Glucose, UA: NEGATIVE mg/dL
Hgb urine dipstick: NEGATIVE
Ketones, ur: NEGATIVE mg/dL
Nitrite: NEGATIVE
Protein, ur: NEGATIVE mg/dL
Specific Gravity, Urine: 1.016 (ref 1.005–1.030)
pH: 7 (ref 5.0–8.0)

## 2019-06-14 LAB — WET PREP, GENITAL
Sperm: NONE SEEN
Trich, Wet Prep: NONE SEEN
Yeast Wet Prep HPF POC: NONE SEEN

## 2019-06-14 LAB — POCT PREGNANCY, URINE: Preg Test, Ur: NEGATIVE

## 2019-06-14 MED ORDER — CEFTRIAXONE SODIUM 250 MG IJ SOLR
250.0000 mg | Freq: Once | INTRAMUSCULAR | Status: AC
  Administered 2019-06-14: 250 mg via INTRAMUSCULAR
  Filled 2019-06-14: qty 250

## 2019-06-14 MED ORDER — ACETAMINOPHEN 325 MG PO TABS
650.0000 mg | ORAL_TABLET | Freq: Once | ORAL | Status: AC
  Administered 2019-06-14: 18:00:00 650 mg via ORAL
  Filled 2019-06-14: qty 2

## 2019-06-14 MED ORDER — METRONIDAZOLE 500 MG PO TABS: 500.0000 mg | ORAL_TABLET | Freq: Three times a day (TID) | ORAL | 0 refills | Status: AC

## 2019-06-14 MED ORDER — AZITHROMYCIN 500 MG PO TABS
1000.0000 mg | ORAL_TABLET | Freq: Once | ORAL | Status: AC
  Administered 2019-06-14: 1000 mg via ORAL
  Filled 2019-06-14: qty 2

## 2019-06-14 NOTE — Discharge Instructions (Addendum)
Take a probiotic pill daily and this will help decrease the amount of bacterial vaginosis that you get.  Take the medication as prescribed and please finish all of the medication.  If your STD testing is positive the nurse will call you and refer you for treatment.  Return to the emergency department if you are worsening.  Tylenol or ibuprofen for your sinus headache as needed.

## 2019-06-14 NOTE — ED Provider Notes (Signed)
[facility] Emergency Department Provider Note  ____________________________________________   First MD Initiated Contact with Patient 06/14/19 1537     (approximate)  I have reviewed the triage vital signs and the nursing notes.   HISTORY  Chief Complaint Vaginal Discharge and Recurrent Sinusitis    HPI Michelle Washington is a 23 y.o. female presents emergency department complaining of a grayish-white vaginal discharge.  She states it does have a odor.  She was diagnosed with BV but did not take her full treatment and thinks it is returned.  She also feels like she is getting a sinus infection as she has sinus pressure.  No headache or loss of smell.  No fever or chills.  No exposure to covid.  She denies any pelvic pain.    Past Medical History:  Diagnosis Date  . Anemia   . Back pain   . Costochondritis     There are no active problems to display for this patient.   Past Surgical History:  Procedure Laterality Date  . TONSILLECTOMY      Prior to Admission medications   Medication Sig Start Date End Date Taking? Authorizing Provider  azithromycin (ZITHROMAX Z-PAK) 250 MG tablet Take 2 tablets (500 mg) on  Day 1,  followed by 1 tablet (250 mg) once daily on Days 2 through 5. 07/31/18   Enid DerryWagner, Ashley, PA-C  cyclobenzaprine (FLEXERIL) 5 MG tablet Take 1-2 tablets 3 times daily as needed 03/19/18   Enid DerryWagner, Ashley, PA-C  dicyclomine (BENTYL) 10 MG capsule Take 1 capsule (10 mg total) by mouth 3 (three) times daily as needed for spasms. 06/26/17 07/10/17  Willy Eddyobinson, Patrick, MD  fexofenadine-pseudoephedrine (ALLEGRA-D) 60-120 MG 12 hr tablet Take 1 tablet by mouth 2 (two) times daily. 07/28/18   Joni ReiningSmith, Ronald K, PA-C  fluticasone (FLONASE) 50 MCG/ACT nasal spray Place 2 sprays into both nostrils daily. 07/31/18 07/31/19  Enid DerryWagner, Ashley, PA-C  ibuprofen (ADVIL,MOTRIN) 800 MG tablet Take 1 tablet (800 mg total) by mouth every 8 (eight) hours as needed. 03/19/18    Enid DerryWagner, Ashley, PA-C  lidocaine (XYLOCAINE) 2 % solution Use as directed 5 mLs in the mouth or throat every 6 (six) hours as needed for mouth pain. For oral swish and swallow. 07/28/18   Joni ReiningSmith, Ronald K, PA-C  metroNIDAZOLE (FLAGYL) 500 MG tablet Take 1 tablet (500 mg total) by mouth 3 (three) times daily for 7 days. 06/14/19 06/21/19  Sherrie MustacheFisher, Roselyn BeringSusan W, PA-C  ondansetron (ZOFRAN ODT) 4 MG disintegrating tablet Take 1 tablet (4 mg total) by mouth every 8 (eight) hours as needed for nausea or vomiting. 12/15/18   Jene EveryKinner, Robert, MD  polyethylene glycol (MIRALAX / Ethelene HalGLYCOLAX) packet Take 17 g by mouth daily. Mix one tablespoon with 8oz of your favorite juice or water every day until you are having soft formed stools. Then start taking once daily if you didn't have a stool the day before. 02/15/17   Willy Eddyobinson, Patrick, MD  starch (ANUSOL) 51 % suppository Place 1 suppository rectally as needed for pain. 02/15/17   Willy Eddyobinson, Patrick, MD    Allergies Watermelon [citrullus vulgaris]  History reviewed. No pertinent family history.  Social History Social History   Tobacco Use  . Smoking status: Current Some Day Smoker    Packs/day: 1.00    Types: Cigars  . Smokeless tobacco: Never Used  Substance Use Topics  . Alcohol use: No  . Drug use: No    Review of Systems  Constitutional: No fever/chills Eyes: No visual  changes. ENT: No sore throat.  Positive sinus pressure Respiratory: Denies cough Genitourinary: Negative for dysuria.  Positive vaginal discharge Musculoskeletal: Negative for back pain. Skin: Negative for rash.    ____________________________________________   PHYSICAL EXAM:  VITAL SIGNS: ED Triage Vitals  Enc Vitals Group     BP 06/14/19 1522 124/65     Pulse Rate 06/14/19 1522 (!) 105     Resp 06/14/19 1522 16     Temp 06/14/19 1522 99.2 F (37.3 C)     Temp Source 06/14/19 1522 Oral     SpO2 06/14/19 1522 98 %     Weight 06/14/19 1523 180 lb (81.6 kg)     Height 06/14/19  1523 5\' 6"  (1.676 m)     Head Circumference --      Peak Flow --      Pain Score 06/14/19 1523 8     Pain Loc --      Pain Edu? --      Excl. in [place]? --     Constitutional: Alert and oriented. Well appearing and in no acute distress. Eyes: Conjunctivae are normal.  Head: Atraumatic.  Ethmoid sinus minimally tender to palpation. Nose: No congestion/rhinnorhea. Mouth/Throat: Mucous membranes are moist.   Neck:  supple no lymphadenopathy noted Cardiovascular: Normal rate, regular rhythm. Heart sounds are normal Respiratory: Normal respiratory effort.  No retractions, lungs c t a  Abd: soft nontender bs normal all 4 quad GU: Vaginal exam does not show any external lesions, speculum exam shows a thick white yellow-gray discharge noted in the pelvic vault, cervix is normal, no herpetic lesions are noted Musculoskeletal: FROM all extremities, warm and well perfused Neurologic:  Normal speech and language.  Skin:  Skin is warm, dry and intact. No rash noted. Psychiatric: Mood and affect are normal. Speech and behavior are normal.  ____________________________________________   LABS (all labs ordered are listed, but only abnormal results are displayed)  Labs Reviewed  CHLAMYDIA/NGC RT PCR ([facility] ONLY) - Abnormal; Notable for the following components:      Result Value   N gonorrhoeae DETECTED (*)    All other components within normal limits  WET PREP, GENITAL - Abnormal; Notable for the following components:   Clue Cells Wet Prep HPF POC PRESENT (*)    WBC, Wet Prep HPF POC MANY (*)    All other components within normal limits  URINALYSIS, COMPLETE (UACMP) WITH MICROSCOPIC - Abnormal; Notable for the following components:   Color, Urine YELLOW (*)    APPearance HAZY (*)    Leukocytes,Ua SMALL (*)    All other components within normal limits  POC URINE PREG, ED  POCT PREGNANCY, URINE   ____________________________________________    ____________________________________________  RADIOLOGY    ____________________________________________   PROCEDURES  Procedure(s) performed: No  Procedures    ____________________________________________   INITIAL IMPRESSION / ASSESSMENT AND PLAN / ED COURSE  Pertinent labs & imaging results that were available during my care of the patient were reviewed by me and considered in my medical decision making (see chart for details).   Patient is a 23 year old female presents emergency department complaint of vaginal discharge and sinus pressure.  Physical exam shows the patient to appear well.  Vaginal exam shows a grayish-yellow discharge.  POC pregnancy is negative, UA, wet prep, GC/chlamydia ordered.  Wet prep shows clue cells and WBCs.  Patient was given a prescription for Flagyl.  I will call her with results of her gonorrhea test if she does not want  to wait as time.  Call patient to notify her that her gonorrhea test is positive.  She is returning to the emergency department tonight for treatment.    Michelle Washington was evaluated in Emergency Department on 06/14/2019 for the symptoms described in the history of present illness. She was evaluated in the context of the global COVID-19 pandemic, which necessitated consideration that the patient might be at risk for infection with the SARS-CoV-2 virus that causes COVID-19. Institutional protocols and algorithms that pertain to the evaluation of patients at risk for COVID-19 are in a state of rapid change based on information released by regulatory bodies including the CDC and federal and state organizations. These policies and algorithms were followed during the patient's care in the ED.   As part of my medical decision making, I reviewed the following data within the electronic MEDICAL RECORD NUMBER Nursing notes reviewed and incorporated, Labs reviewed wet prep positive for clue cells, GC/chlamydia is positive for gonorrhea,  urinalysis is normal, POC pregnancy is negative, Old chart reviewed, Notes from prior ED visits and [place] Controlled Substance Database  ____________________________________________   FINAL CLINICAL IMPRESSION(S) / ED DIAGNOSES  Final diagnoses:  BV (bacterial vaginosis)  Concern about STD in female without diagnosis      NEW MEDICATIONS STARTED DURING THIS VISIT:  Discharge Medication List as of 06/14/2019  6:06 PM    START taking these medications   Details  metroNIDAZOLE (FLAGYL) 500 MG tablet Take 1 tablet (500 mg total) by mouth 3 (three) times daily for 7 days., Starting Sun 06/14/2019, Until Sun 06/21/2019, Normal         Note:  This document was prepared using Dragon voice recognition software and may include unintentional dictation errors.    Faythe GheeFisher, Susan W, PA-C 06/14/19 2012    Shaune PollackIsaacs, Cameron, MD 06/20/19 1046

## 2019-06-14 NOTE — ED Notes (Signed)
Pt verbalized understanding of discharge instructions. NAD at this time. 

## 2019-06-14 NOTE — ED Notes (Signed)
Patient was seen here earlier today. Patient was called and asked to return to ED due to lab/test results from today.

## 2019-06-14 NOTE — Discharge Instructions (Addendum)
Follow-up at the [facility] for HIV and syphilis testing.

## 2019-06-14 NOTE — ED Triage Notes (Signed)
Pt to ED via POV c/o gray/white vaginal discharge. Pt states that she was diagnosed with BV but she did not use the full treatment last time because her symptoms were getting better. Pt also thinks she is getting a sinus infection, pt states that her nose is hurting and she is having sinus pressure and congestion. Pt is in NAD.

## 2019-06-14 NOTE — ED Provider Notes (Signed)
[facility] Emergency Department Provider Note  ____________________________________________   First MD Initiated Contact with Patient 06/14/19 1926     (approximate)  I have reviewed the triage vital signs and the nursing notes.   HISTORY  Chief Complaint std check    HPI Michelle Washington is a 23 y.o. female returns to the emergency department to get treated for her STD.  Her gonorrhea test came back as positive.    Past Medical History:  Diagnosis Date  . Anemia   . Back pain   . Costochondritis     There are no active problems to display for this patient.   Past Surgical History:  Procedure Laterality Date  . TONSILLECTOMY      Prior to Admission medications   Medication Sig Start Date End Date Taking? Authorizing Provider  azithromycin (ZITHROMAX Z-PAK) 250 MG tablet Take 2 tablets (500 mg) on  Day 1,  followed by 1 tablet (250 mg) once daily on Days 2 through 5. 07/31/18   Enid DerryWagner, Ashley, PA-C  cyclobenzaprine (FLEXERIL) 5 MG tablet Take 1-2 tablets 3 times daily as needed 03/19/18   Enid DerryWagner, Ashley, PA-C  dicyclomine (BENTYL) 10 MG capsule Take 1 capsule (10 mg total) by mouth 3 (three) times daily as needed for spasms. 06/26/17 07/10/17  Willy Eddyobinson, Patrick, MD  fexofenadine-pseudoephedrine (ALLEGRA-D) 60-120 MG 12 hr tablet Take 1 tablet by mouth 2 (two) times daily. 07/28/18   Joni ReiningSmith, Ronald K, PA-C  fluticasone (FLONASE) 50 MCG/ACT nasal spray Place 2 sprays into both nostrils daily. 07/31/18 07/31/19  Enid DerryWagner, Ashley, PA-C  ibuprofen (ADVIL,MOTRIN) 800 MG tablet Take 1 tablet (800 mg total) by mouth every 8 (eight) hours as needed. 03/19/18   Enid DerryWagner, Ashley, PA-C  lidocaine (XYLOCAINE) 2 % solution Use as directed 5 mLs in the mouth or throat every 6 (six) hours as needed for mouth pain. For oral swish and swallow. 07/28/18   Joni ReiningSmith, Ronald K, PA-C  metroNIDAZOLE (FLAGYL) 500 MG tablet Take 1 tablet (500 mg total) by mouth 3 (three) times daily for 7  days. 06/14/19 06/21/19  Sherrie MustacheFisher, Roselyn BeringSusan W, PA-C  ondansetron (ZOFRAN ODT) 4 MG disintegrating tablet Take 1 tablet (4 mg total) by mouth every 8 (eight) hours as needed for nausea or vomiting. 12/15/18   Jene EveryKinner, Robert, MD  polyethylene glycol (MIRALAX / Ethelene HalGLYCOLAX) packet Take 17 g by mouth daily. Mix one tablespoon with 8oz of your favorite juice or water every day until you are having soft formed stools. Then start taking once daily if you didn't have a stool the day before. 02/15/17   Willy Eddyobinson, Patrick, MD  starch (ANUSOL) 51 % suppository Place 1 suppository rectally as needed for pain. 02/15/17   Willy Eddyobinson, Patrick, MD    Allergies Watermelon [citrullus vulgaris]  No family history on file.  Social History Social History   Tobacco Use  . Smoking status: Current Some Day Smoker    Packs/day: 1.00    Types: Cigars  . Smokeless tobacco: Never Used  Substance Use Topics  . Alcohol use: No  . Drug use: No    Review of Systems  Constitutional: No fever/chills Eyes: No visual changes. ENT: No sore throat. Respiratory: Denies cough Genitourinary: Negative for dysuria.  Positive vaginal discharge Musculoskeletal: Negative for back pain. Skin: Negative for rash.    ____________________________________________   PHYSICAL EXAM:  VITAL SIGNS: ED Triage Vitals  Enc Vitals Group     BP 06/14/19 1930 120/71     Pulse Rate 06/14/19 1930  93     Resp 06/14/19 1930 16     Temp 06/14/19 1930 98 F (36.7 C)     Temp Source 06/14/19 1930 Oral     SpO2 06/14/19 1930 99 %     Weight 06/14/19 1931 180 lb (81.6 kg)     Height 06/14/19 1931 5\' 6"  (1.676 m)     Head Circumference --      Peak Flow --      Pain Score 06/14/19 1930 9     Pain Loc --      Pain Edu? --      Excl. in [place]? --     Constitutional: Alert and oriented. Well appearing and in no acute distress. Eyes: Conjunctivae are normal.  Head: Atraumatic. Nose: No congestion/rhinnorhea. Mouth/Throat: Mucous membranes are  moist.   Neck:  supple no lymphadenopathy noted GU: deferred Musculoskeletal: FROM all extremities, warm and well perfused Neurologic:  Normal speech and language.  Skin:  Skin is warm, dry and intact. No rash noted. Psychiatric: Mood and affect are normal. Speech and behavior are normal.  ____________________________________________   LABS (all labs ordered are listed, but only abnormal results are displayed)  Labs Reviewed - No data to display ____________________________________________   ____________________________________________  RADIOLOGY    ____________________________________________   PROCEDURES  Procedure(s) performed: Rocephin 250 mg IM, Zithromax 1 g p.o.  Procedures    ____________________________________________   INITIAL IMPRESSION / ASSESSMENT AND PLAN / ED COURSE  Pertinent labs & imaging results that were available during my care of the patient were reviewed by me and considered in my medical decision making (see chart for details).   Patient is 23 year old female presents emergency department due to a positive gonorrhea test.  She is here to be treated.  I have called her to return emergency department after receiving the results.  We gave her 250 mg of Rocephin IM and 1 g of Zithromax p.o.  She currently has a prescription for Flagyl for the bacterial vaginosis which would also empirically treat her for trichomoniasis.    Michelle Washington was evaluated in Emergency Department on 06/14/2019 for the symptoms described in the history of present illness. She was evaluated in the context of the global COVID-19 pandemic, which necessitated consideration that the patient might be at risk for infection with the SARS-CoV-2 virus that causes COVID-19. Institutional protocols and algorithms that pertain to the evaluation of patients at risk for COVID-19 are in a state of rapid change based on information released by regulatory bodies including the CDC and federal and  state organizations. These policies and algorithms were followed during the patient's care in the ED.   As part of my medical decision making, I reviewed the following data within the [place] notes reviewed and incorporated, Labs reviewed labs from earlier showed positive gonorrhea test., Old chart reviewed, Notes from prior ED visits and  Controlled Substance Database  ____________________________________________   FINAL CLINICAL IMPRESSION(S) / ED DIAGNOSES  Final diagnoses:  Gonorrhea      NEW MEDICATIONS STARTED DURING THIS VISIT:  New Prescriptions   No medications on file     Note:  This document was prepared using Dragon voice recognition software and may include unintentional dictation errors.    Versie Starks, PA-C 06/14/19 2009    Duffy Bruce, MD 06/20/19 1046

## 2019-06-14 NOTE — ED Triage Notes (Signed)
Pt states she is here to "get medicine" for an STD. Pt denies other symptoms.

## 2019-06-23 ENCOUNTER — Other Ambulatory Visit: Payer: Self-pay

## 2019-06-23 ENCOUNTER — Emergency Department
Admission: EM | Admit: 2019-06-23 | Discharge: 2019-06-23 | Disposition: A | Discharge status: Home / Self Care | Payer: Self-pay | Attending: Emergency Medicine | Admitting: Emergency Medicine

## 2019-06-23 ENCOUNTER — Encounter: Payer: Self-pay | Admitting: Emergency Medicine

## 2019-06-23 DIAGNOSIS — J0100 Acute maxillary sinusitis, unspecified: Secondary | ICD-10-CM | POA: Insufficient documentation

## 2019-06-23 DIAGNOSIS — R070 Pain in throat: Secondary | ICD-10-CM | POA: Insufficient documentation

## 2019-06-23 DIAGNOSIS — Z79899 Other long term (current) drug therapy: Secondary | ICD-10-CM | POA: Insufficient documentation

## 2019-06-23 DIAGNOSIS — F17290 Nicotine dependence, other tobacco product, uncomplicated: Secondary | ICD-10-CM | POA: Insufficient documentation

## 2019-06-23 LAB — GROUP A STREP BY PCR: Group A Strep by PCR: NOT DETECTED

## 2019-06-23 MED ORDER — FEXOFENADINE-PSEUDOEPHED ER 60-120 MG PO TB12: 1.0000 | ORAL_TABLET | Freq: Two times a day (BID) | ORAL | 0 refills | Status: DC

## 2019-06-23 MED ORDER — AMOXICILLIN 500 MG PO CAPS: 500.0000 mg | ORAL_CAPSULE | Freq: Three times a day (TID) | ORAL | 0 refills | Status: DC

## 2019-06-23 NOTE — ED Provider Notes (Signed)
[facility] Emergency Department Provider Note   ____________________________________________   None    (approximate)  I have reviewed the triage vital signs and the nursing notes.   HISTORY  Chief Complaint Nasal Congestion    HPI Michelle Washington is a 23 y.o. female patient complained of nasal congestion and sore throat for greater than 1 week.  Patient state onset with facial pain and nasal congestion is now progressed to sore throat secondary postnasal drainage.  Patient denies nausea, vomiting, diarrhea.  Patient denies fever/chills.  Patient states frontal headache.  Patient rates her pain discomfort a 10/10.  Patient described the pain is "pressure/sore".  No palliative measure for complaint.  Past Medical History:  Diagnosis Date  . Anemia   . Back pain   . Costochondritis     There are no active problems to display for this patient.   Past Surgical History:  Procedure Laterality Date  . TONSILLECTOMY      Prior to Admission medications   Medication Sig Start Date End Date Taking? Authorizing Provider  amoxicillin (AMOXIL) 500 MG capsule Take 1 capsule (500 mg total) by mouth 3 (three) times daily. 06/23/19   Joni ReiningSmith, Ronald K, PA-C  dicyclomine (BENTYL) 10 MG capsule Take 1 capsule (10 mg total) by mouth 3 (three) times daily as needed for spasms. 06/26/17 07/10/17  Willy Eddyobinson, Patrick, MD  fexofenadine-pseudoephedrine (ALLEGRA-D) 60-120 MG 12 hr tablet Take 1 tablet by mouth 2 (two) times daily. 07/28/18   Joni ReiningSmith, Ronald K, PA-C  fexofenadine-pseudoephedrine (ALLEGRA-D) 60-120 MG 12 hr tablet Take 1 tablet by mouth 2 (two) times daily. 06/23/19   Joni ReiningSmith, Ronald K, PA-C  fluticasone (FLONASE) 50 MCG/ACT nasal spray Place 2 sprays into both nostrils daily. 07/31/18 07/31/19  Enid DerryWagner, Ashley, PA-C    Allergies Watermelon [citrullus vulgaris]  No family history on file.  Social History Social History   Tobacco Use  . Smoking status: Current Some  Day Smoker    Packs/day: 1.00    Types: Cigars  . Smokeless tobacco: Never Used  Substance Use Topics  . Alcohol use: No  . Drug use: No    Review of Systems Constitutional: No fever/chills Eyes: No visual changes. ENT: Nasal congestion and sore throat. Cardiovascular: Denies chest pain. Respiratory: Denies shortness of breath. Gastrointestinal: No abdominal pain.  No nausea, no vomiting.  No diarrhea.  No constipation. Genitourinary: Negative for dysuria. Musculoskeletal: Negative for back pain. Skin: Negative for rash. Neurological: Negative for headaches, focal weakness or numbness. Allergic/Immunilogical: Watermelon  ____________________________________________   PHYSICAL EXAM:  VITAL SIGNS: ED Triage Vitals  Enc Vitals Group     BP 06/23/19 0637 (!) 119/91     Pulse Rate 06/23/19 0637 83     Resp 06/23/19 0637 20     Temp 06/23/19 0637 98.5 F (36.9 C)     Temp Source 06/23/19 0637 Oral     SpO2 06/23/19 0637 100 %     Weight 06/23/19 0638 179 lb 14.3 oz (81.6 kg)     Height 06/23/19 0638 5\' 6"  (1.676 m)     Head Circumference --      Peak Flow --      Pain Score 06/23/19 0638 10     Pain Loc --      Pain Edu? --      Excl. in [place]? --    Constitutional: Alert and oriented. Well appearing and in no acute distress. Nose: Edematous nasal turbinates.  Bilateral maxillary guarding.  Mouth/Throat: Mucous  membranes are moist.  Oropharynx erythematous. Neck: No stridor.   Hematological/Lymphatic/Immunilogical: No cervical lymphadenopathy. Cardiovascular: Normal rate, regular rhythm. Grossly normal heart sounds.  Good peripheral circulation. Respiratory: Normal respiratory effort.  No retractions. Lungs CTAB. Gastrointestinal: Soft and nontender. No distention. No abdominal bruits. No CVA tenderness. Musculoskeletal: No lower extremity tenderness nor edema.  No joint effusions. Neurologic:  Normal speech and language. No gross focal neurologic deficits are  appreciated. No gait instability. Skin:  Skin is warm, dry and intact. No rash noted. Psychiatric: Mood and affect are normal. Speech and behavior are normal.  ____________________________________________   LABS (all labs ordered are listed, but only abnormal results are displayed)  Labs Reviewed  GROUP A STREP BY PCR   ____________________________________________  EKG   ____________________________________________  RADIOLOGY  ED MD interpretation:    Official radiology report(s): No results found.  ____________________________________________   PROCEDURES  Procedure(s) performed (including Critical Care):  Procedures   ____________________________________________   INITIAL IMPRESSION / ASSESSMENT AND PLAN / ED COURSE  As part of my medical decision making, I reviewed the following data within the electronic MEDICAL RECORD NUMBER     Michelle Washington was evaluated in Emergency Department on 06/23/2019 for the symptoms described in the history of present illness. She was evaluated in the context of the global COVID-19 pandemic, which necessitated consideration that the patient might be at risk for infection with the SARS-CoV-2 virus that causes COVID-19. Institutional protocols and algorithms that pertain to the evaluation of patients at risk for COVID-19 are in a state of rapid change based on information released by regulatory bodies including the CDC and federal and state organizations. These policies and algorithms were followed during the patient's care in the ED.     Patient presents with nasal congestion sore throat greater than 1 week.  Strep test was negative.  Patient physical exam consistent with subacute maxillary sinusitis.  Patient given discharge care instruction advised take medication as directed.  Patient advised follow-up [facility] clinic.      ____________________________________________   FINAL CLINICAL IMPRESSION(S) / ED DIAGNOSES  Final  diagnoses:  Subacute maxillary sinusitis     ED Discharge Orders         Ordered    amoxicillin (AMOXIL) 500 MG capsule  3 times daily     06/23/19 0744    fexofenadine-pseudoephedrine (ALLEGRA-D) 60-120 MG 12 hr tablet  2 times daily     06/23/19 0744           Note:  This document was prepared using Dragon voice recognition software and may include unintentional dictation errors.    Sable Feil, PA-C 06/23/19 0093    Schuyler Amor, MD 06/23/19 [phone]

## 2019-06-23 NOTE — ED Notes (Signed)
See triage note   States she was seen last week for nasal congestion   States she cont's to have nasal congestion and sinus pressure  Also has scratchy throat   Denies any fever

## 2019-06-23 NOTE — ED Triage Notes (Signed)
Patient to ER for c/o nasal congestion and requesting to be "rechecked for STD". Patient states she was seen 10 days ago for, doesn't feel like her nasal symptoms have gotten better. +Sore throat.

## 2019-08-10 ENCOUNTER — Ambulatory Visit: Payer: Self-pay

## 2019-08-12 ENCOUNTER — Other Ambulatory Visit: Payer: Self-pay

## 2019-08-12 ENCOUNTER — Emergency Department
Admission: EM | Admit: 2019-08-12 | Discharge: 2019-08-12 | Disposition: A | Discharge status: Home / Self Care | Payer: Self-pay | Attending: Emergency Medicine | Admitting: Emergency Medicine

## 2019-08-12 ENCOUNTER — Encounter: Payer: Self-pay | Admitting: Emergency Medicine

## 2019-08-12 DIAGNOSIS — R519 Headache, unspecified: Secondary | ICD-10-CM | POA: Insufficient documentation

## 2019-08-12 DIAGNOSIS — F17210 Nicotine dependence, cigarettes, uncomplicated: Secondary | ICD-10-CM | POA: Insufficient documentation

## 2019-08-12 LAB — CBC
HCT: 40.2 % (ref 36.0–46.0)
Hemoglobin: 12.6 g/dL (ref 12.0–15.0)
MCH: 26.4 pg (ref 26.0–34.0)
MCHC: 31.3 g/dL (ref 30.0–36.0)
MCV: 84.3 fL (ref 80.0–100.0)
Platelets: 169 10*3/uL (ref 150–400)
RBC: 4.77 MIL/uL (ref 3.87–5.11)
RDW: 13.6 % (ref 11.5–15.5)
WBC: 8.6 10*3/uL (ref 4.0–10.5)
nRBC: 0 % (ref 0.0–0.2)

## 2019-08-12 LAB — BASIC METABOLIC PANEL
Anion gap: 9 (ref 5–15)
BUN: 9 mg/dL (ref 6–20)
CO2: 25 mmol/L (ref 22–32)
Calcium: 8.8 mg/dL — ABNORMAL LOW (ref 8.9–10.3)
Chloride: 106 mmol/L (ref 98–111)
Creatinine, Ser: 0.77 mg/dL (ref 0.44–1.00)
GFR calc Af Amer: >60 mL/min (ref >60)
GFR calc non Af Amer: >60 mL/min (ref >60)
Glucose, Bld: 98 mg/dL (ref 70–99)
Potassium: 3.8 mmol/L (ref 3.5–5.1)
Sodium: 140 mmol/L (ref 135–145)

## 2019-08-12 LAB — POCT PREGNANCY, URINE: Preg Test, Ur: NEGATIVE

## 2019-08-12 MED ORDER — ACETAMINOPHEN 325 MG PO TABS
650.0000 mg | ORAL_TABLET | Freq: Once | ORAL | Status: AC
  Administered 2019-08-12: 650 mg via ORAL
  Filled 2019-08-12: qty 2

## 2019-08-12 NOTE — ED Triage Notes (Signed)
Pt in via POV, reports migraine x one day.  Pt states, "I have just felt really weak and like Im gonna pass out."  NAD noted at this time.

## 2019-08-12 NOTE — ED Provider Notes (Signed)
[facility] Emergency Department Provider Note   ____________________________________________    I have reviewed the triage vital signs and the nursing notes.   HISTORY  Chief Complaint Migraine     HPI Michelle Washington is a 23 y.o. female who presents with complaints of a headache..  Patient reports that she had a throbbing frontal headache today which made her feel somewhat dizzy.  She reports her headache is significantly improved but she does feel fatigued and she reports in the past she has had low iron levels when she feels like this.  She denies neuro deficits.  No fevers chills nausea vomiting.  No neck pain.  Took some ibuprofen with significant improvement.  She is also concerned that she may be pregnant.  She requested pregnancy test.  Past Medical History:  Diagnosis Date  . Anemia   . Back pain   . Costochondritis     There are no active problems to display for this patient.   Past Surgical History:  Procedure Laterality Date  . TONSILLECTOMY      Prior to Admission medications   Medication Sig Start Date End Date Taking? Authorizing Provider  amoxicillin (AMOXIL) 500 MG capsule Take 1 capsule (500 mg total) by mouth 3 (three) times daily. 06/23/19   Sable Feil, PA-C  dicyclomine (BENTYL) 10 MG capsule Take 1 capsule (10 mg total) by mouth 3 (three) times daily as needed for spasms. 06/26/17 07/10/17  Merlyn Lot, MD  fexofenadine-pseudoephedrine (ALLEGRA-D) 60-120 MG 12 hr tablet Take 1 tablet by mouth 2 (two) times daily. 07/28/18   Sable Feil, PA-C  fexofenadine-pseudoephedrine (ALLEGRA-D) 60-120 MG 12 hr tablet Take 1 tablet by mouth 2 (two) times daily. 06/23/19   Sable Feil, PA-C  fluticasone (FLONASE) 50 MCG/ACT nasal spray Place 2 sprays into both nostrils daily. 07/31/18 07/31/19  Laban Emperor, PA-C     Allergies Watermelon [citrullus vulgaris]  No family history on file.  Social History Social History    Tobacco Use  . Smoking status: Current Some Day Smoker    Packs/day: 1.00    Types: Cigars  . Smokeless tobacco: Never Used  Substance Use Topics  . Alcohol use: No  . Drug use: No    Review of Systems  Constitutional: No fever/chills Eyes: No visual changes.  ENT: No neck pain Cardiovascular: Denies chest pain. Respiratory: Denies shortness of breath. Gastrointestinal: No abdominal pain.  No nausea, no vomiting.   Genitourinary: Negative for dysuria. Musculoskeletal: Negative for back pain. Skin: Negative for rash. Neurological: As above   ____________________________________________   PHYSICAL EXAM:  VITAL SIGNS: ED Triage Vitals  Enc Vitals Group     BP 08/12/19 1720 127/80     Pulse Rate 08/12/19 1720 (!) 101     Resp 08/12/19 1720 16     Temp 08/12/19 1720 98.8 F (37.1 C)     Temp Source 08/12/19 1720 Oral     SpO2 08/12/19 1720 99 %     Weight 08/12/19 1720 81.6 kg (180 lb)     Height 08/12/19 1720 1.651 m (5\' 5" )     Head Circumference --      Peak Flow --      Pain Score 08/12/19 1721 8     Pain Loc --      Pain Edu? --      Excl. in [place]? --     Constitutional: Alert and oriented. No acute distress.  Eyes: Conjunctivae are normal.  PERRLA, EOMI Head: Atraumatic. Nose: No congestion/rhinnorhea.  Neck:  Painless ROM Cardiovascular: Normal rate, regular rhythm.  Good peripheral circulation. Respiratory: Normal respiratory effort.  No retractions. Gastrointestinal: Soft and nontender. No distention.     Neurologic:  Normal speech and language. No gross focal neurologic deficits are appreciated.  Cranial nerves II 12 are normal Skin:  Skin is warm, dry and intact. No rash noted. Psychiatric: Mood and affect are normal. Speech and behavior are normal.  ____________________________________________   LABS (all labs ordered are listed, but only abnormal results are displayed)  Labs Reviewed  BASIC METABOLIC PANEL - Abnormal; Notable for the  following components:      Result Value   Calcium 8.8 (*)    All other components within normal limits  CBC  POCT PREGNANCY, URINE   ____________________________________________  EKG   ____________________________________________  RADIOLOGY  None ____________________________________________   PROCEDURES  Procedure(s) performed: No  Procedures   Critical Care performed: No ____________________________________________   INITIAL IMPRESSION / ASSESSMENT AND PLAN / ED COURSE  Pertinent labs & imaging results that were available during my care of the patient were reviewed by me and considered in my medical decision making (see chart for details).  Patient overall well-appearing and in no acute distress, reassuring exam, she reports her headache is already significantly improved after taking ibuprofen.  She is requesting pregnancy test.  Given her history of iron deficiency anemia we will check hemoglobin, POCT urine.  Will give p.o. Tylenol  Hemoglobin unremarkable, POCT pregnancy negative.  Patient reports she is feeling significantly better.  Appropriate for discharge at this time, return precautions discussed.    ____________________________________________   FINAL CLINICAL IMPRESSION(S) / ED DIAGNOSES  Final diagnoses:  Generalized headache        Note:  This document was prepared using Dragon voice recognition software and may include unintentional dictation errors.   Jene Every, MD 08/12/19 1904

## 2019-08-25 ENCOUNTER — Encounter: Payer: Self-pay | Admitting: Emergency Medicine

## 2019-08-25 ENCOUNTER — Emergency Department
Admission: EM | Admit: 2019-08-25 | Discharge: 2019-08-25 | Disposition: A | Discharge status: Home / Self Care | Payer: Self-pay | Attending: Emergency Medicine | Admitting: Emergency Medicine

## 2019-08-25 ENCOUNTER — Other Ambulatory Visit: Payer: Self-pay

## 2019-08-25 DIAGNOSIS — F17290 Nicotine dependence, other tobacco product, uncomplicated: Secondary | ICD-10-CM | POA: Insufficient documentation

## 2019-08-25 DIAGNOSIS — J302 Other seasonal allergic rhinitis: Secondary | ICD-10-CM | POA: Insufficient documentation

## 2019-08-25 MED ORDER — FLUTICASONE PROPIONATE 50 MCG/ACT NA SUSP: 2.0000 | Freq: Every day | NASAL | 0 refills | Status: DC

## 2019-08-25 NOTE — ED Provider Notes (Signed)
[facility] Emergency Department Provider Note  ____________________________________________   First MD Initiated Contact with Patient 08/25/19 [phone]     (approximate)  I have reviewed the triage vital signs and the nursing notes.   HISTORY  Chief Complaint Sore Throat   HPI Panama Mckee is a 23 y.o. female presents to the ED with complaint of sore throat and sinus drainage that started yesterday.  Patient states she is aware that she does have allergy symptoms and has not been taking any over-the-counter medication for her symptoms.  She denies any fever, chills, nausea or vomiting.  She denies any known contact with Covid.  She denies any other Covid symptoms.  Patient states that she does not have a primary care provider.  She also in the past has used a steroid nasal spray but does not have a prescription for it as well.  She rates her pain as 10/10.     Past Medical History:  Diagnosis Date  . Anemia   . Back pain   . Costochondritis     There are no active problems to display for this patient.   Past Surgical History:  Procedure Laterality Date  . TONSILLECTOMY      Prior to Admission medications   Medication Sig Start Date End Date Taking? Authorizing Provider  dicyclomine (BENTYL) 10 MG capsule Take 1 capsule (10 mg total) by mouth 3 (three) times daily as needed for spasms. 06/26/17 07/10/17  Willy Eddy, MD  fluticasone (FLONASE) 50 MCG/ACT nasal spray Place 2 sprays into both nostrils daily. 08/25/19 08/24/20  Tommi Rumps, PA-C    Allergies Watermelon [citrullus vulgaris]  No family history on file.  Social History Social History   Tobacco Use  . Smoking status: Current Some Day Smoker    Packs/day: 1.00    Types: Cigars  . Smokeless tobacco: Never Used  Substance Use Topics  . Alcohol use: No  . Drug use: No    Review of Systems Constitutional: No fever/chills Eyes: No visual changes. ENT: Positive nasal  congestion.  Positive posterior drainage.  Positive sneezing. Cardiovascular: Denies chest pain. Respiratory: Denies shortness of breath. Gastrointestinal: No abdominal pain.  No nausea, no vomiting.  Musculoskeletal: Negative for back pain. Skin: Negative for rash. Neurological: Negative for headaches, focal weakness or numbness. ____________________________________________   PHYSICAL EXAM:  VITAL SIGNS: ED Triage Vitals [08/25/19 0639]  Enc Vitals Group     BP 120/71     Pulse Rate 94     Resp 18     Temp 97.9 F (36.6 C)     Temp Source Oral     SpO2 99 %     Weight 180 lb (81.6 kg)     Height 5\' 6"  (1.676 m)     Head Circumference      Peak Flow      Pain Score 10     Pain Loc      Pain Edu?      Excl. in [place]?    Constitutional: Alert and oriented. Well appearing and in no acute distress. Eyes: Conjunctivae are normal.  Head: Atraumatic. Nose: Mild congestion/rhinnorhea.  EACs and TMs are clear bilaterally. Mouth/Throat: Mucous membranes are moist.  Oropharynx non-erythematous.  Positive posterior drainage. Neck: No stridor.   Hematological/Lymphatic/Immunilogical: No cervical lymphadenopathy. Cardiovascular: Normal rate, regular rhythm. Grossly normal heart sounds.  Good peripheral circulation. Respiratory: Normal respiratory effort.  No retractions. Lungs CTAB. Musculoskeletal: No lower extremity tenderness nor edema.  No joint  effusions. Neurologic:  Normal speech and language. No gross focal neurologic deficits are appreciated. No gait instability. Skin:  Skin is warm, dry and intact. No rash noted. Psychiatric: Mood and affect are normal. Speech and behavior are normal.  ____________________________________________   LABS (all labs ordered are listed, but only abnormal results are displayed)  Labs Reviewed - No data to display   PROCEDURES  Procedure(s) performed (including Critical Care):  Procedures   ____________________________________________    INITIAL IMPRESSION / ASSESSMENT AND PLAN / ED COURSE  As part of my medical decision making, I reviewed the following data within the electronic MEDICAL RECORD NUMBER Notes from prior ED visits and [place] Controlled Substance Database  [place] Hibler was evaluated in Emergency Department on 08/25/2019 for the symptoms described in the history of present illness. She was evaluated in the context of the global COVID-19 pandemic, which necessitated consideration that the patient might be at risk for infection with the SARS-CoV-2 virus that causes COVID-19. Institutional protocols and algorithms that pertain to the evaluation of patients at risk for COVID-19 are in a state of rapid change based on information released by regulatory bodies including the CDC and federal and state organizations. These policies and algorithms were followed during the patient's care in the ED.  23 year old female presents to the ED with complaint of nasal congestion, sneezing, posterior drainage without history of fever, chills, nausea or vomiting.  There has been no known exposure to Covid.  Patient has a history of seasonal allergies and has not been taking any over-the-counter medication.  Physical exam is consistent with seasonal allergies.  Patient is to begin taking Zyrtec daily.  A prescription for Flonase nasal spray was sent to her pharmacy.  Patient is to increase fluids.  She is encouraged to follow-up with her PCP if any continued problems or return to the emergency department if any severe worsening of her symptoms. ____________________________________________   FINAL CLINICAL IMPRESSION(S) / ED DIAGNOSES  Final diagnoses:  Seasonal allergies     ED Discharge Orders         Ordered    fluticasone (FLONASE) 50 MCG/ACT nasal spray  Daily     08/25/19 0726           Note:  This document was prepared using Dragon voice recognition software and may include unintentional dictation errors.    Johnn Hai,  PA-C 08/25/19 [place]    Blake Divine, MD 08/26/19 1322

## 2019-08-25 NOTE — ED Notes (Signed)
Pt alert and oriented X 4, stable for discharge. RR even and unlabored, color WNL. Discussed discharge instructions and follow up when appropriate. Instructed to follow up with ER for any life threatening symptoms or concerns that patient or family of patient may have  

## 2019-08-25 NOTE — Discharge Instructions (Addendum)
Call make an appointment with one of the clinics listed on your discharge papers.  You need to establish a primary care provider.  [facility] is also available to you and the contact information and address is listed on your discharge papers.  Begin taking Zyrtec daily.  Flonase was sent to your pharmacy to use daily as you have done in the past.  Increase fluids.  Tylenol if needed for throat pain.

## 2019-08-25 NOTE — ED Triage Notes (Signed)
Patient ambulatory to triage with steady gait, without difficulty or distress noted, mask in place; pt reports sore throat since yesterday with sinus drainage

## 2019-08-26 ENCOUNTER — Telehealth: Payer: Self-pay

## 2019-08-26 NOTE — Telephone Encounter (Signed)
Called pt on 10/21 at 3:42pm and gave information about new patient application process. Patient has financial appt on 10/28 at 1:00pm

## 2019-09-02 ENCOUNTER — Other Ambulatory Visit: Payer: Self-pay

## 2019-09-02 ENCOUNTER — Ambulatory Visit: Payer: Self-pay

## 2019-09-09 ENCOUNTER — Ambulatory Visit: Payer: Self-pay

## 2019-10-03 ENCOUNTER — Encounter: Payer: Self-pay | Admitting: Emergency Medicine

## 2019-10-03 ENCOUNTER — Emergency Department
Admission: EM | Admit: 2019-10-03 | Discharge: 2019-10-03 | Disposition: A | Discharge status: Home / Self Care | Payer: Self-pay | Attending: Emergency Medicine | Admitting: Emergency Medicine

## 2019-10-03 ENCOUNTER — Other Ambulatory Visit: Payer: Self-pay

## 2019-10-03 DIAGNOSIS — F17290 Nicotine dependence, other tobacco product, uncomplicated: Secondary | ICD-10-CM | POA: Insufficient documentation

## 2019-10-03 DIAGNOSIS — Z79899 Other long term (current) drug therapy: Secondary | ICD-10-CM | POA: Insufficient documentation

## 2019-10-03 DIAGNOSIS — N760 Acute vaginitis: Secondary | ICD-10-CM | POA: Insufficient documentation

## 2019-10-03 DIAGNOSIS — B9689 Other specified bacterial agents as the cause of diseases classified elsewhere: Secondary | ICD-10-CM

## 2019-10-03 LAB — COMPREHENSIVE METABOLIC PANEL
ALT: 6 U/L (ref 0–44)
AST: 15 U/L (ref 15–41)
Albumin: 3.9 g/dL (ref 3.5–5.0)
Alkaline Phosphatase: 33 U/L — ABNORMAL LOW (ref 38–126)
Anion gap: 7 (ref 5–15)
BUN: 10 mg/dL (ref 6–20)
CO2: 25 mmol/L (ref 22–32)
Calcium: 8.8 mg/dL — ABNORMAL LOW (ref 8.9–10.3)
Chloride: 105 mmol/L (ref 98–111)
Creatinine, Ser: 0.6 mg/dL (ref 0.44–1.00)
GFR calc Af Amer: >60 mL/min (ref >60)
GFR calc non Af Amer: >60 mL/min (ref >60)
Glucose, Bld: 102 mg/dL — ABNORMAL HIGH (ref 70–99)
Potassium: 3.6 mmol/L (ref 3.5–5.1)
Sodium: 137 mmol/L (ref 135–145)
Total Bilirubin: 0.8 mg/dL (ref 0.3–1.2)
Total Protein: 6.7 g/dL (ref 6.5–8.1)

## 2019-10-03 LAB — URINALYSIS, COMPLETE (UACMP) WITH MICROSCOPIC
Bacteria, UA: NONE SEEN
Bilirubin Urine: NEGATIVE
Glucose, UA: NEGATIVE mg/dL
Hgb urine dipstick: NEGATIVE
Ketones, ur: NEGATIVE mg/dL
Nitrite: NEGATIVE
Protein, ur: NEGATIVE mg/dL
Specific Gravity, Urine: 1.023 (ref 1.005–1.030)
WBC, UA: >50 WBC/hpf — ABNORMAL HIGH (ref 0–5)
pH: 7 (ref 5.0–8.0)

## 2019-10-03 LAB — CBC
HCT: 38.6 % (ref 36.0–46.0)
Hemoglobin: 12.6 g/dL (ref 12.0–15.0)
MCH: 26.1 pg (ref 26.0–34.0)
MCHC: 32.6 g/dL (ref 30.0–36.0)
MCV: 80.1 fL (ref 80.0–100.0)
Platelets: 139 10*3/uL — ABNORMAL LOW (ref 150–400)
RBC: 4.82 MIL/uL (ref 3.87–5.11)
RDW: 13.3 % (ref 11.5–15.5)
WBC: 8.1 10*3/uL (ref 4.0–10.5)
nRBC: 0 % (ref 0.0–0.2)

## 2019-10-03 LAB — WET PREP, GENITAL
Sperm: NONE SEEN
Trich, Wet Prep: NONE SEEN
Yeast Wet Prep HPF POC: NONE SEEN

## 2019-10-03 LAB — LIPASE, BLOOD: Lipase: 67 U/L — ABNORMAL HIGH (ref 11–51)

## 2019-10-03 LAB — POCT PREGNANCY, URINE: Preg Test, Ur: NEGATIVE

## 2019-10-03 MED ORDER — METRONIDAZOLE 500 MG PO TABS: 500.0000 mg | ORAL_TABLET | Freq: Two times a day (BID) | ORAL | 0 refills | Status: AC

## 2019-10-03 MED ORDER — IBUPROFEN 800 MG PO TABS
800.0000 mg | ORAL_TABLET | Freq: Once | ORAL | Status: AC
  Administered 2019-10-03: 16:00:00 800 mg via ORAL
  Filled 2019-10-03: qty 1

## 2019-10-03 MED ORDER — SODIUM CHLORIDE 0.9% FLUSH: 3.0000 mL | Freq: Once | INTRAVENOUS | Status: DC

## 2019-10-03 MED ORDER — IBUPROFEN 800 MG PO TABS: 800.0000 mg | ORAL_TABLET | Freq: Three times a day (TID) | ORAL | 0 refills | Status: DC | PRN

## 2019-10-03 MED ORDER — METRONIDAZOLE 500 MG PO TABS
500.0000 mg | ORAL_TABLET | Freq: Once | ORAL | Status: AC
  Administered 2019-10-03: 16:00:00 500 mg via ORAL
  Filled 2019-10-03: qty 1

## 2019-10-03 NOTE — ED Triage Notes (Signed)
Lower abdominal pain x 2 days. States has had not had period since July.

## 2019-10-03 NOTE — ED Provider Notes (Signed)
[facility] Emergency Department Provider Note ____________________________________________  Time seen: 1410  I have reviewed the triage vital signs and the nursing notes.  HISTORY  Chief Complaint  Abdominal Pain  HPI Michelle Washington is a 23 y.o. female presents herself to the ED for evaluation of 2-day complaint of lower abdominal pain.   Patient denies any nausea, vomiting, diarrhea.  She is also denied any interim fevers, chills, or sweats.  She has not had a menstrual period since July.  She reports she was due for her  Depo injection in July, which she missed.  She has not seen anybody for symptoms and has not taken any medication since onset.  Past Medical History:  Diagnosis Date  . Anemia   . Back pain   . Costochondritis     There are no active problems to display for this patient.   Past Surgical History:  Procedure Laterality Date  . TONSILLECTOMY      Prior to Admission medications   Medication Sig Start Date End Date Taking? Authorizing Provider  ferrous sulfate 325 (65 FE) MG tablet Take 325 mg by mouth daily with breakfast.   Yes [provider]  ibuprofen (ADVIL) 800 MG tablet Take 1 tablet (800 mg total) by mouth every 8 (eight) hours as needed. 10/03/19   Saige Busby, Dannielle Karvonen, PA-C  metroNIDAZOLE (FLAGYL) 500 MG tablet Take 1 tablet (500 mg total) by mouth 2 (two) times daily for 7 days. 10/03/19 10/10/19  Jacques Willingham, Dannielle Karvonen, PA-C    Allergies Watermelon [citrullus vulgaris]  History reviewed. No pertinent family history.  Social History Social History   Tobacco Use  . Smoking status: Current Some Day Smoker    Packs/day: 1.00    Types: Cigars  . Smokeless tobacco: Never Used  Substance Use Topics  . Alcohol use: No  . Drug use: No    Review of Systems  Constitutional: Negative for fever. Cardiovascular: Negative for chest pain. Respiratory: Negative for shortness of breath. Gastrointestinal: Negative  for abdominal pain, vomiting and diarrhea. Genitourinary: Negative for dysuria. Lower pelvic pain. Musculoskeletal: Negative for back pain. Skin: Negative for rash. Neurological: Negative for headaches, focal weakness or numbness. ____________________________________________  PHYSICAL EXAM:  VITAL SIGNS: ED Triage Vitals  Enc Vitals Group     BP 10/03/19 1308 114/68     Pulse Rate 10/03/19 1308 96     Resp 10/03/19 1308 18     Temp 10/03/19 1308 98.1 F (36.7 C)     Temp Source 10/03/19 1308 Oral     SpO2 10/03/19 1308 100 %     Weight 10/03/19 1310 180 lb (81.6 kg)     Height 10/03/19 1310 5\' 6"  (1.676 m)     Head Circumference --      Peak Flow --      Pain Score 10/03/19 1310 10     Pain Loc --      Pain Edu? --      Excl. in [place]? --     Constitutional: Alert and oriented. Well appearing and in no distress. Head: Normocephalic and atraumatic. Eyes: Conjunctivae are normal. Normal extraocular movements Cardiovascular: Normal rate, regular rhythm. Normal distal pulses. Respiratory: Normal respiratory effort. No wheezes/rales/rhonchi. Gastrointestinal: Soft and nontender. No distention, rebound, guarding, or rigidity. No CVA tenderness GU: normal external genitalia. White vaginal discharge and mucous from the os. No CMT or adnexal masses noted.   Musculoskeletal: Nontender with normal range of motion in all extremities.  Neurologic:  Normal gait without ataxia. Normal speech and language. No gross focal neurologic deficits are appreciated. Skin:  Skin is warm, dry and intact. No rash noted. Psychiatric: Mood and affect are normal. Patient exhibits appropriate insight and judgment. ____________________________________________   LABS (pertinent positives/negatives) Labs Reviewed  WET PREP, GENITAL - Abnormal; Notable for the following components:      Result Value   Clue Cells Wet Prep HPF POC PRESENT (*)    WBC, Wet Prep HPF POC MODERATE (*)    All other components  within normal limits  LIPASE, BLOOD - Abnormal; Notable for the following components:   Lipase 67 (*)    All other components within normal limits  COMPREHENSIVE METABOLIC PANEL - Abnormal; Notable for the following components:   Glucose, Bld 102 (*)    Calcium 8.8 (*)    Alkaline Phosphatase 33 (*)    All other components within normal limits  CBC - Abnormal; Notable for the following components:   Platelets 139 (*)    All other components within normal limits  URINALYSIS, COMPLETE (UACMP) WITH MICROSCOPIC - Abnormal; Notable for the following components:   Color, Urine YELLOW (*)    APPearance CLEAR (*)    Leukocytes,Ua SMALL (*)    WBC, UA >50 (*)    All other components within normal limits  POC URINE PREG, ED  POCT PREGNANCY, URINE  ____________________________________________  PROCEDURES  Flagyl 500 mg PO IBU 800 mg PO Procedures ____________________________________________  INITIAL IMPRESSION / ASSESSMENT AND PLAN / ED COURSE  Patient with ED evaluation of lower abdominal pain and vaginal discharge.  Clinical picture is consistent with BV vaginitis.  Patient be treated with metronidazole, and is encouraged to follow-up with a local department for ongoing symptoms and further testing.  Return precautions have been reviewed.  Michelle Washington was evaluated in Emergency Department on 10/03/2019 for the symptoms described in the history of present illness. She was evaluated in the context of the global COVID-19 pandemic, which necessitated consideration that the patient might be at risk for infection with the SARS-CoV-2 virus that causes COVID-19. Institutional protocols and algorithms that pertain to the evaluation of patients at risk for COVID-19 are in a state of rapid change based on information released by regulatory bodies including the CDC and federal and state organizations. These policies and algorithms were followed during the patient's care in the  ED. ____________________________________________  FINAL CLINICAL IMPRESSION(S) / ED DIAGNOSES  Final diagnoses:  BV (bacterial vaginosis)      Karmen Stabs, Charlesetta Ivory, PA-C 10/03/19 1534    Jene Every, MD 10/04/19 1555

## 2019-10-03 NOTE — Discharge Instructions (Signed)
Your exam shows BV. Take the prescription antibiotic as directed. Follow-up with [facility] Department for ongoing symptoms.

## 2019-10-22 ENCOUNTER — Other Ambulatory Visit: Payer: Self-pay

## 2019-10-22 ENCOUNTER — Encounter: Payer: Self-pay | Admitting: Emergency Medicine

## 2019-10-22 ENCOUNTER — Emergency Department
Admission: EM | Admit: 2019-10-22 | Discharge: 2019-10-22 | Disposition: A | Discharge status: Home / Self Care | Payer: Self-pay | Attending: Emergency Medicine | Admitting: Emergency Medicine

## 2019-10-22 DIAGNOSIS — F17290 Nicotine dependence, other tobacco product, uncomplicated: Secondary | ICD-10-CM | POA: Insufficient documentation

## 2019-10-22 DIAGNOSIS — N939 Abnormal uterine and vaginal bleeding, unspecified: Secondary | ICD-10-CM

## 2019-10-22 DIAGNOSIS — N938 Other specified abnormal uterine and vaginal bleeding: Secondary | ICD-10-CM | POA: Insufficient documentation

## 2019-10-22 LAB — POCT PREGNANCY, URINE: Preg Test, Ur: NEGATIVE

## 2019-10-22 MED ORDER — IBUPROFEN 800 MG PO TABS: 800.0000 mg | ORAL_TABLET | Freq: Three times a day (TID) | ORAL | 0 refills | Status: DC | PRN

## 2019-10-22 NOTE — ED Triage Notes (Signed)
Pt reports has been spotting and cramping for a few days and gets the shot for injection and missed her last one and just wants to make sure she is not pregnant.

## 2019-10-22 NOTE — ED Provider Notes (Signed)
[facility] Emergency Department Provider Note  ____________________________________________  Time seen: Approximately 9:11 AM  I have reviewed the triage vital signs and the nursing notes.   HISTORY  Chief Complaint Vaginal Bleeding and Abdominal Pain    HPI Michelle Washington is a 23 y.o. female that presents to the emergency department for evaluation of mid low abdominal cramping and vaginal spotting for 4 days.  Patient dates states that some days vaginal spotting is light pink and some days it is darker brown.  Patient states that her last Depo injection was in July.  She has not yet had a normal menstrual cycle since.  Cramping feels like her normal menstrual cramping but a bit more intense.  She took ibuprofen last night for cramping, which relieved symptoms.  Patient was supposed to be at work this morning at 4 AM and came here shortly after because she will need of note for work.  She is wanting to make sure that she is not pregnant.  She is having unprotected sexual intercourse.  She has no history of ovarian cysts or mass.  No fevers, nausea, vomiting.  No urinary symptoms.  Past Medical History:  Diagnosis Date  . Anemia   . Back pain   . Costochondritis     There are no problems to display for this patient.   Past Surgical History:  Procedure Laterality Date  . TONSILLECTOMY      Prior to Admission medications   Medication Sig Start Date End Date Taking? Authorizing Provider  ferrous sulfate 325 (65 FE) MG tablet Take 325 mg by mouth daily with breakfast.    [provider]  ibuprofen (ADVIL) 800 MG tablet Take 1 tablet (800 mg total) by mouth every 8 (eight) hours as needed. 10/22/19   Laban Emperor, PA-C    Allergies Watermelon [citrullus vulgaris]  No family history on file.  Social History Social History   Tobacco Use  . Smoking status: Current Some Day Smoker    Packs/day: 1.00    Types: Cigars  . Smokeless tobacco:  Never Used  Substance Use Topics  . Alcohol use: No  . Drug use: No     Review of Systems  Constitutional: No fever/chills Cardiovascular: No chest pain. Respiratory: No cough. No SOB. Gastrointestinal: Low mid abdominal cramping.  No nausea, no vomiting.  Musculoskeletal: Negative for musculoskeletal pain. Skin: Negative for rash, abrasions, lacerations, ecchymosis.   ____________________________________________   PHYSICAL EXAM:  VITAL SIGNS: ED Triage Vitals  Enc Vitals Group     BP 10/22/19 0810 119/78     Pulse Rate 10/22/19 0810 83     Resp 10/22/19 0810 20     Temp 10/22/19 0808 98.4 F (36.9 C)     Temp Source 10/22/19 0808 Oral     SpO2 10/22/19 0810 99 %     Weight 10/22/19 0808 175 lb (79.4 kg)     Height 10/22/19 0808 5\' 6"  (1.676 m)     Head Circumference --      Peak Flow --      Pain Score 10/22/19 0808 8     Pain Loc --      Pain Edu? --      Excl. in [place]? --      Constitutional: Alert and oriented. Well appearing and in no acute distress. Eyes: Conjunctivae are normal. PERRL. EOMI. Head: Atraumatic. ENT:      Ears:      Nose: No congestion/rhinnorhea.  Mouth/Throat: Mucous membranes are moist.  Neck: No stridor.   Cardiovascular: Normal rate, regular rhythm.  Good peripheral circulation. Respiratory: Normal respiratory effort without tachypnea or retractions. Lungs CTAB. Good air entry to the bases with no decreased or absent breath sounds. Gastrointestinal: Bowel sounds 4 quadrants.  Soft central mid low abdominal tenderness.  No left lower quadrant or right lower quadrant tenderness.  No guarding or rigidity. No palpable masses. No distention.  Musculoskeletal: Full range of motion to all extremities. No gross deformities appreciated. Neurologic:  Normal speech and language. No gross focal neurologic deficits are appreciated.  Skin:  Skin is warm, dry and intact. No rash noted. Psychiatric: Mood and affect are normal. Speech and behavior  are normal. Patient exhibits appropriate insight and judgement.   ____________________________________________   LABS (all labs ordered are listed, but only abnormal results are displayed)  Labs Reviewed  POCT PREGNANCY, URINE   ____________________________________________  EKG   ____________________________________________  RADIOLOGY   No results found.  ____________________________________________    PROCEDURES  Procedure(s) performed:    Procedures    Medications - No data to display   ____________________________________________   INITIAL IMPRESSION / ASSESSMENT AND PLAN / ED COURSE  Pertinent labs & imaging results that were available during my care of the patient were reviewed by me and considered in my medical decision making (see chart for details).  Review of the [place] CSRS was performed in accordance of the [facility] prior to dispensing any controlled drugs.   Patient's diagnosis is consistent with menstrual bleeding.  Clinical exam and symptoms are consistent with menstrual spotting following cessation of Depo injections.  Pain is relieved with NSAIDs.  Patient overall appears well.  Patient will be discharged home with prescriptions for ibuprofen. Patient is to follow up with gynecology as directed. Patient is given ED precautions to return to the ED for any worsening or new symptoms.  Michelle Washington was evaluated in Emergency Department on 10/22/2019 for the symptoms described in the history of present illness. She was evaluated in the context of the global COVID-19 pandemic, which necessitated consideration that the patient might be at risk for infection with the SARS-CoV-2 virus that causes COVID-19. Institutional protocols and algorithms that pertain to the evaluation of patients at risk for COVID-19 are in a state of rapid change based on information released by regulatory bodies including the CDC and federal and state organizations. These policies and  algorithms were followed during the patient's care in the ED.   ____________________________________________  FINAL CLINICAL IMPRESSION(S) / ED DIAGNOSES  Final diagnoses:  Vaginal spotting      NEW MEDICATIONS STARTED DURING THIS VISIT:  ED Discharge Orders         Ordered    ibuprofen (ADVIL) 800 MG tablet  Every 8 hours PRN     10/22/19 0919              This chart was dictated using voice recognition software/Dragon. Despite best efforts to proofread, errors can occur which can change the meaning. Any change was purely unintentional.    Enid Derry, PA-C 10/22/19 1554    Emily Filbert, MD 10/23/19 [phone]

## 2019-10-22 NOTE — ED Notes (Signed)
Pt verbalizes understanding of d/c instructions, medications and follow up 

## 2019-10-22 NOTE — ED Notes (Signed)
Provider in room to assess pt.

## 2019-10-28 ENCOUNTER — Ambulatory Visit: Payer: Self-pay

## 2019-10-28 DIAGNOSIS — E663 Overweight: Secondary | ICD-10-CM

## 2019-12-24 ENCOUNTER — Encounter: Payer: Self-pay | Admitting: Emergency Medicine

## 2019-12-24 ENCOUNTER — Emergency Department: Payer: Self-pay

## 2019-12-24 ENCOUNTER — Other Ambulatory Visit: Payer: Self-pay

## 2019-12-24 ENCOUNTER — Emergency Department
Admission: EM | Admit: 2019-12-24 | Discharge: 2019-12-24 | Disposition: A | Discharge status: Home / Self Care | Payer: Self-pay | Attending: Emergency Medicine | Admitting: Emergency Medicine

## 2019-12-24 DIAGNOSIS — Y9301 Activity, walking, marching and hiking: Secondary | ICD-10-CM | POA: Insufficient documentation

## 2019-12-24 DIAGNOSIS — R0781 Pleurodynia: Secondary | ICD-10-CM | POA: Insufficient documentation

## 2019-12-24 DIAGNOSIS — Y998 Other external cause status: Secondary | ICD-10-CM | POA: Insufficient documentation

## 2019-12-24 DIAGNOSIS — Y9289 Other specified places as the place of occurrence of the external cause: Secondary | ICD-10-CM | POA: Insufficient documentation

## 2019-12-24 DIAGNOSIS — M545 Low back pain, unspecified: Secondary | ICD-10-CM

## 2019-12-24 DIAGNOSIS — Y92009 Unspecified place in unspecified non-institutional (private) residence as the place of occurrence of the external cause: Secondary | ICD-10-CM

## 2019-12-24 DIAGNOSIS — F17290 Nicotine dependence, other tobacco product, uncomplicated: Secondary | ICD-10-CM | POA: Insufficient documentation

## 2019-12-24 DIAGNOSIS — W19XXXA Unspecified fall, initial encounter: Secondary | ICD-10-CM

## 2019-12-24 DIAGNOSIS — W108XXA Fall (on) (from) other stairs and steps, initial encounter: Secondary | ICD-10-CM | POA: Insufficient documentation

## 2019-12-24 LAB — POCT PREGNANCY, URINE: Preg Test, Ur: NEGATIVE

## 2019-12-24 MED ORDER — NAPROXEN 500 MG PO TABS: 500.0000 mg | ORAL_TABLET | Freq: Two times a day (BID) | ORAL | 0 refills | Status: DC

## 2019-12-24 MED ORDER — TRAMADOL HCL 50 MG PO TABS: 50.0000 mg | ORAL_TABLET | Freq: Four times a day (QID) | ORAL | 0 refills | Status: DC | PRN

## 2019-12-24 NOTE — ED Triage Notes (Signed)
Says she fell on Tuesday onto back.  Says back pain, rib pain and says her right little finger the nail pulled back and laso her pinky nail.

## 2019-12-24 NOTE — ED Provider Notes (Signed)
[facility] Emergency Department Provider Note  ____________________________________________   First MD Initiated Contact with Patient 12/24/19 1100     (approximate)  I have reviewed the triage vital signs and the nursing notes.   HISTORY  Chief Complaint Fall and Back Pain   HPI Michelle Washington is a 24 y.o. female presents to the ED with complaint of back pain and left rib pain since she fell missing a step and trying to catch herself.  She states that she hit her left ribs which have continued to hurt.  This all happened 2 days ago.  She also injured her right fifth finger but states that there is no pain with it today and she is able to move it without any difficulty.  She denies any head injury or loss of consciousness.  She has not taken any over-the-counter medication for her injuries.  Currently she rates her pain as a 10/10.       Past Medical History:  Diagnosis Date  . Anemia   . Back pain   . Costochondritis     Patient Active Problem List   Diagnosis Date Noted  . Overweight 05/24/2015    Past Surgical History:  Procedure Laterality Date  . TONSILLECTOMY      Prior to Admission medications   Medication Sig Start Date End Date Taking? Authorizing Provider  ferrous sulfate 325 (65 FE) MG tablet Take 325 mg by mouth daily with breakfast.    [provider]  naproxen (NAPROSYN) 500 MG tablet Take 1 tablet (500 mg total) by mouth 2 (two) times daily with a meal. 12/24/19   Johnn Hai, PA-C  traMADol (ULTRAM) 50 MG tablet Take 1 tablet (50 mg total) by mouth every 6 (six) hours as needed. 12/24/19   Johnn Hai, PA-C    Allergies Watermelon [citrullus vulgaris]  Family History  Problem Relation Age of Onset  . Diabetes Mother   . Hypertension Mother   . Sarcoidosis Mother   . Alcohol abuse Maternal Grandmother   . Diabetes Maternal Grandmother   . Seizures Maternal Grandmother     Social History Social  History   Tobacco Use  . Smoking status: Current Some Day Smoker    Packs/day: 1.00    Types: Cigars  . Smokeless tobacco: Never Used  Substance Use Topics  . Alcohol use: No  . Drug use: No    Review of Systems Constitutional: No fever/chills Eyes: No visual changes. ENT: No trauma. Cardiovascular: Denies chest pain. Respiratory: Denies shortness of breath. Gastrointestinal: No abdominal pain.  No nausea, no vomiting.   Musculoskeletal: Positive for low back pain, positive for left rib pain. Skin: Negative for rash. Neurological: Negative for headaches, focal weakness or numbness.   ____________________________________________   PHYSICAL EXAM:  VITAL SIGNS: ED Triage Vitals  Enc Vitals Group     BP 12/24/19 1044 114/63     Pulse Rate 12/24/19 1044 77     Resp 12/24/19 1044 14     Temp 12/24/19 1044 98.4 F (36.9 C)     Temp Source 12/24/19 1044 Oral     SpO2 12/24/19 1044 100 %     Weight 12/24/19 1045 170 lb (77.1 kg)     Height 12/24/19 1045 5\' 6"  (1.676 m)     Head Circumference --      Peak Flow --      Pain Score 12/24/19 1045 10     Pain Loc --  Pain Edu? --      Excl. in GC? --     Constitutional: Alert and oriented. Well appearing and in no acute distress. Eyes: Conjunctivae are normal. PERRL. EOMI. Head: Atraumatic. Nose: No trauma. Neck: No stridor.  No cervical tenderness on palpation posteriorly. Cardiovascular: Normal rate, regular rhythm. Grossly normal heart sounds.  Good peripheral circulation. Respiratory: Normal respiratory effort.  No retractions. Lungs CTAB.  Left lateral ribs with moderate tenderness to light palpation.  No soft tissue edema or discoloration is noted. Gastrointestinal: Soft and nontender. No distention.  Musculoskeletal: There is no point tenderness on palpation of the thoracic spine however patient is diffusely tender on palpation of the lumbar and paravertebral muscles.  No gross deformity and no discoloration is  noted of the skin.  Patient range of motion is not limited and patient is ambulatory without any assistance.  There is no point tenderness or injury to the lower extremities.  Patient initially said that her right fifth finger was injured 2 days ago however she is moving it and having no pain today.  No gross deformity and no soft tissue edema present. Neurologic:  Normal speech and language. No gross focal neurologic deficits are appreciated. No gait instability. Skin:  Skin is warm, dry and intact.  No abrasions, ecchymosis or erythema noted. Psychiatric: Mood and affect are normal. Speech and behavior are normal.  ____________________________________________   LABS (all labs ordered are listed, but only abnormal results are displayed)  Labs Reviewed  POC URINE PREG, ED  POCT PREGNANCY, URINE   RADIOLOGY   Official radiology report(s): DG Ribs Unilateral W/Chest Left  Result Date: 12/24/2019 CLINICAL DATA:  Pain following fall EXAM: LEFT RIBS AND CHEST - 3+ VIEW COMPARISON:  Chest radiograph November 11, 2017 FINDINGS: Frontal chest as well as oblique and cone-down rib images obtained. The lungs are clear. The heart size and pulmonary vascularity are normal. No adenopathy. No pneumothorax or pleural effusion. No evident rib fracture. IMPRESSION: No evident rib fracture.  Lungs clear. Electronically Signed   By: Bretta Bang III M.D.   On: 12/24/2019 12:00   DG Lumbar Spine 2-3 Views  Result Date: 12/24/2019 CLINICAL DATA:  Pain following fall EXAM: LUMBAR SPINE - 2-3 VIEW COMPARISON:  August 14, 2015 FINDINGS: Frontal and lateral views were obtained. There are 5 non-rib-bearing lumbar type vertebral bodies. There is no fracture or spondylolisthesis. Disc spaces appear normal. No erosive change. IMPRESSION: No fracture or spondylolisthesis.  No evident arthropathy. Electronically Signed   By: Bretta Bang III M.D.   On: 12/24/2019 12:01     ____________________________________________   PROCEDURES  Procedure(s) performed (including Critical Care):  Procedures   ____________________________________________   INITIAL IMPRESSION / ASSESSMENT AND PLAN / ED COURSE  As part of my medical decision making, I reviewed the following data within the electronic MEDICAL RECORD NUMBER Notes from prior ED visits and [place] Controlled Substance Database  24 year old female presents to the ED with complaint of left rib pain and low back pain since an injury 2 days ago which she fell down a step and tried to catch herself.  Initially said that her right hand was injured but states that in the last 2 days it is gotten better and she is able to move all digits without any difficulty.  Patient is tender on palpation to the left lateral ribs.  There is tenderness to the lumbar and paravertebral muscles.  Patient is ambulatory without any assistance.  X-rays were negative and patient  was made aware.  She was placed on naproxen 500 mg twice daily with food and tramadol every 6 hours as needed for pain.  She is encouraged to use ice or heat to her muscles as needed for discomfort.  She will return to the emergency department if any severe worsening of her symptoms and follow-up with her PCP.  ____________________________________________   FINAL CLINICAL IMPRESSION(S) / ED DIAGNOSES  Final diagnoses:  Acute bilateral low back pain without sciatica  Rib pain on left side  Fall in home, initial encounter     ED Discharge Orders         Ordered    naproxen (NAPROSYN) 500 MG tablet  2 times daily with meals     12/24/19 1223    traMADol (ULTRAM) 50 MG tablet  Every 6 hours PRN     12/24/19 1223           Note:  This document was prepared using Dragon voice recognition software and may include unintentional dictation errors.    Tommi Rumps, PA-C 12/24/19 1339    Shaune Pollack, MD 12/25/19 1140

## 2019-12-24 NOTE — ED Notes (Signed)
See triage note  Presents with pain to left lateral rib area and lower back  States she missed her step.Marland Kitchentried to catch herself  And hit her rib area   Ambulates well

## 2019-12-24 NOTE — Discharge Instructions (Addendum)
Follow-up with your primary care provider if any continued problems or [facility] acute care if any additional pain medication as needed.  Ice or heat to your back and ribs as needed for discomfort.  Begin taking naproxen 500 mg twice daily with food which is for inflammation and pain.  The tramadol is for pain every 6 hours.  This medication may cause drowsiness so do not take this and drive.

## 2020-05-12 ENCOUNTER — Telehealth: Payer: Self-pay | Admitting: General Practice

## 2020-05-12 NOTE — Telephone Encounter (Signed)
Individual has been contacted 3+ times regarding ED referral. No further attempts to contact individual will be made. 

## 2020-09-08 ENCOUNTER — Encounter: Payer: Self-pay | Admitting: Emergency Medicine

## 2020-09-08 ENCOUNTER — Other Ambulatory Visit: Payer: Self-pay

## 2020-09-08 ENCOUNTER — Emergency Department
Admission: EM | Admit: 2020-09-08 | Discharge: 2020-09-08 | Disposition: A | Discharge status: Home / Self Care | Payer: Self-pay | Attending: Emergency Medicine | Admitting: Emergency Medicine

## 2020-09-08 DIAGNOSIS — A084 Viral intestinal infection, unspecified: Secondary | ICD-10-CM | POA: Insufficient documentation

## 2020-09-08 DIAGNOSIS — F17290 Nicotine dependence, other tobacco product, uncomplicated: Secondary | ICD-10-CM | POA: Insufficient documentation

## 2020-09-08 LAB — URINALYSIS, COMPLETE (UACMP) WITH MICROSCOPIC
Bilirubin Urine: NEGATIVE
Glucose, UA: NEGATIVE mg/dL
Hgb urine dipstick: NEGATIVE
Ketones, ur: NEGATIVE mg/dL
Nitrite: NEGATIVE
Protein, ur: NEGATIVE mg/dL
Specific Gravity, Urine: 1.025 (ref 1.005–1.030)
pH: 6 (ref 5.0–8.0)

## 2020-09-08 LAB — CBC
HCT: 38.7 % (ref 36.0–46.0)
Hemoglobin: 12.1 g/dL (ref 12.0–15.0)
MCH: 26.6 pg (ref 26.0–34.0)
MCHC: 31.3 g/dL (ref 30.0–36.0)
MCV: 85.1 fL (ref 80.0–100.0)
Platelets: 121 10*3/uL — ABNORMAL LOW (ref 150–400)
RBC: 4.55 MIL/uL (ref 3.87–5.11)
RDW: 12.8 % (ref 11.5–15.5)
WBC: 9 10*3/uL (ref 4.0–10.5)
nRBC: 0 % (ref 0.0–0.2)

## 2020-09-08 LAB — COMPREHENSIVE METABOLIC PANEL
ALT: 6 U/L (ref 0–44)
AST: 16 U/L (ref 15–41)
Albumin: 3.9 g/dL (ref 3.5–5.0)
Alkaline Phosphatase: 27 U/L — ABNORMAL LOW (ref 38–126)
Anion gap: 9 (ref 5–15)
BUN: 10 mg/dL (ref 6–20)
CO2: 25 mmol/L (ref 22–32)
Calcium: 8.6 mg/dL — ABNORMAL LOW (ref 8.9–10.3)
Chloride: 102 mmol/L (ref 98–111)
Creatinine, Ser: 0.79 mg/dL (ref 0.44–1.00)
GFR, Estimated: >60 mL/min (ref >60)
Glucose, Bld: 103 mg/dL — ABNORMAL HIGH (ref 70–99)
Potassium: 4 mmol/L (ref 3.5–5.1)
Sodium: 136 mmol/L (ref 135–145)
Total Bilirubin: 0.7 mg/dL (ref 0.3–1.2)
Total Protein: 6.7 g/dL (ref 6.5–8.1)

## 2020-09-08 LAB — LIPASE, BLOOD: Lipase: 24 U/L (ref 11–51)

## 2020-09-08 LAB — POC URINE PREG, ED: Preg Test, Ur: NEGATIVE

## 2020-09-08 MED ORDER — ONDANSETRON 4 MG PO TBDP: 4.0000 mg | ORAL_TABLET | Freq: Three times a day (TID) | ORAL | 0 refills | Status: DC | PRN

## 2020-09-08 NOTE — ED Provider Notes (Signed)
[facility] Emergency Department Provider Note   ____________________________________________    I have reviewed the triage vital signs and the nursing notes.   HISTORY  Chief Complaint Abdominal Pain, Nausea, and Emesis     HPI Michelle Washington is a 24 y.o. female who presents with complaints of abdominal cramping, nausea vomiting and diarrhea x2 days.  Patient reports on the first night she had significant nausea and vomiting, last night she had more diarrhea nausea has improved.  Also describes intermittent diffuse abdominal cramping.  No sick contacts reported.  No fevers or chills.  Positive fatigue.  Is not take anything for this.  Had Covid in early October, recovered fully.  Past Medical History:  Diagnosis Date  . Anemia   . Back pain   . Costochondritis     Patient Active Problem List   Diagnosis Date Noted  . Overweight 05/24/2015    Past Surgical History:  Procedure Laterality Date  . TONSILLECTOMY      Prior to Admission medications   Medication Sig Start Date End Date Taking? Authorizing Provider  ferrous sulfate 325 (65 FE) MG tablet Take 325 mg by mouth daily with breakfast.    [provider]  naproxen (NAPROSYN) 500 MG tablet Take 1 tablet (500 mg total) by mouth 2 (two) times daily with a meal. 12/24/19   Bridget Hartshorn L, PA-C  ondansetron (ZOFRAN ODT) 4 MG disintegrating tablet Take 1 tablet (4 mg total) by mouth every 8 (eight) hours as needed. 09/08/20   Jene Every, MD  traMADol (ULTRAM) 50 MG tablet Take 1 tablet (50 mg total) by mouth every 6 (six) hours as needed. 12/24/19   Tommi Rumps, PA-C     Allergies Watermelon [citrullus vulgaris]  Family History  Problem Relation Age of Onset  . Diabetes Mother   . Hypertension Mother   . Sarcoidosis Mother   . Alcohol abuse Maternal Grandmother   . Diabetes Maternal Grandmother   . Seizures Maternal Grandmother     Social History Social History    Tobacco Use  . Smoking status: Current Some Day Smoker    Packs/day: 1.00    Types: Cigars  . Smokeless tobacco: Never Used  Vaping Use  . Vaping Use: Never used  Substance Use Topics  . Alcohol use: No  . Drug use: No    Review of Systems  Constitutional: No fever/chills Eyes: No visual changes.  ENT: No sore throat. Cardiovascular: Denies chest pain. Respiratory: Denies shortness of breath. Gastrointestinal: As above Genitourinary: Negative for dysuria. Musculoskeletal: Negative for back pain. Skin: Negative for rash. Neurological: Negative for headaches   ____________________________________________   PHYSICAL EXAM:  VITAL SIGNS: ED Triage Vitals  Enc Vitals Group     BP 09/08/20 0845 117/69     Pulse Rate 09/08/20 0845 88     Resp 09/08/20 0845 16     Temp 09/08/20 0845 99.1 F (37.3 C)     Temp Source 09/08/20 0845 Oral     SpO2 09/08/20 0845 100 %     Weight 09/08/20 0844 77.1 kg (169 lb 15.6 oz)     Height 09/08/20 0844 1.676 m (5\' 6" )     Head Circumference --      Peak Flow --      Pain Score 09/08/20 0844 9     Pain Loc --      Pain Edu? --      Excl. in GC? --  Constitutional: Alert and oriented. No acute distress.  Eyes: Conjunctivae are normal.  Head: Atraumatic. Nose: No congestion/rhinnorhea.  Cardiovascular: Normal rate, regular rhythm.  Good peripheral circulation. Respiratory: Normal respiratory effort.  No retractions. Lungs CTAB. Gastrointestinal: Soft and nontender. No distention.  No CVA tenderness.  Musculoskeletal: No lower extremity tenderness nor edema.  Warm and well perfused Neurologic:  Normal speech and language. No gross focal neurologic deficits are appreciated.  Skin:  Skin is warm, dry and intact. No rash noted. Psychiatric: Mood and affect are normal. Speech and behavior are normal.  ____________________________________________   LABS (all labs ordered are listed, but only abnormal results are  displayed)  Labs Reviewed  COMPREHENSIVE METABOLIC PANEL - Abnormal; Notable for the following components:      Result Value   Glucose, Bld 103 (*)    Calcium 8.6 (*)    Alkaline Phosphatase 27 (*)    All other components within normal limits  CBC - Abnormal; Notable for the following components:   Platelets 121 (*)    All other components within normal limits  URINALYSIS, COMPLETE (UACMP) WITH MICROSCOPIC - Abnormal; Notable for the following components:   Color, Urine YELLOW (*)    APPearance CLOUDY (*)    Leukocytes,Ua LARGE (*)    Bacteria, UA RARE (*)    All other components within normal limits  LIPASE, BLOOD  POC URINE PREG, ED   ____________________________________________  EKG  None ____________________________________________  RADIOLOGY  None ____________________________________________   PROCEDURES  Procedure(s) performed: No  Procedures   Critical Care performed: No ____________________________________________   INITIAL IMPRESSION / ASSESSMENT AND PLAN / ED COURSE  Pertinent labs & imaging results that were available during my care of the patient were reviewed by me and considered in my medical decision making (see chart for details).  Patient well-appearing in no acute distress, nausea vomiting abdominal cramping as above including diarrhea.  Highly suspicious for viral gastroenteritis, reassuring abdominal exam.  Lab work today is unremarkable, normal lipase, normal white blood cell count.  Urinalysis with squamous epi cells is consistent with contamination  Patient feeling well here overall, well DC with ODT Zofran for presumed viral gastroenteritis, recommend rest hydration return precautions discussed.    ____________________________________________   FINAL CLINICAL IMPRESSION(S) / ED DIAGNOSES  Final diagnoses:  Viral gastroenteritis        Note:  This document was prepared using Dragon voice recognition software and may include  unintentional dictation errors.   Jene Every, MD 09/08/20 1246

## 2020-09-08 NOTE — ED Triage Notes (Signed)
C/O lower abdominal cramping x 2 days.  States had some vomiting 2 nights ago and last night had some diarrhea.  AAOx3.  Skin warm and dry. NAD

## 2020-09-08 NOTE — ED Notes (Signed)
POC did not cross over. Spoke with RN Alvino Chapel we are going to run another POC to confirm.

## 2020-09-08 NOTE — ED Notes (Signed)
POC was negative 1x per Tech MelG POC was negative 2x per RN Alvino Chapel  POC was not crossing over.

## 2020-10-17 ENCOUNTER — Emergency Department
Admission: EM | Admit: 2020-10-17 | Discharge: 2020-10-17 | Disposition: A | Discharge status: Home / Self Care | Payer: Self-pay | Attending: Emergency Medicine | Admitting: Emergency Medicine

## 2020-10-17 ENCOUNTER — Emergency Department: Payer: Self-pay

## 2020-10-17 ENCOUNTER — Other Ambulatory Visit: Payer: Self-pay

## 2020-10-17 DIAGNOSIS — N3000 Acute cystitis without hematuria: Secondary | ICD-10-CM | POA: Insufficient documentation

## 2020-10-17 DIAGNOSIS — R1031 Right lower quadrant pain: Secondary | ICD-10-CM

## 2020-10-17 DIAGNOSIS — F17290 Nicotine dependence, other tobacco product, uncomplicated: Secondary | ICD-10-CM | POA: Insufficient documentation

## 2020-10-17 LAB — CBC
HCT: 39.9 % (ref 36.0–46.0)
Hemoglobin: 12.3 g/dL (ref 12.0–15.0)
MCH: 26.2 pg (ref 26.0–34.0)
MCHC: 30.8 g/dL (ref 30.0–36.0)
MCV: 84.9 fL (ref 80.0–100.0)
Platelets: 133 10*3/uL — ABNORMAL LOW (ref 150–400)
RBC: 4.7 MIL/uL (ref 3.87–5.11)
RDW: 12.8 % (ref 11.5–15.5)
WBC: 8.2 10*3/uL (ref 4.0–10.5)
nRBC: 0 % (ref 0.0–0.2)

## 2020-10-17 LAB — URINALYSIS, COMPLETE (UACMP) WITH MICROSCOPIC
Bilirubin Urine: NEGATIVE
Glucose, UA: NEGATIVE mg/dL
Hgb urine dipstick: NEGATIVE
Ketones, ur: NEGATIVE mg/dL
Nitrite: NEGATIVE
Protein, ur: NEGATIVE mg/dL
Specific Gravity, Urine: 1.021 (ref 1.005–1.030)
pH: 6 (ref 5.0–8.0)

## 2020-10-17 LAB — COMPREHENSIVE METABOLIC PANEL
ALT: 6 U/L (ref 0–44)
AST: 16 U/L (ref 15–41)
Albumin: 3.9 g/dL (ref 3.5–5.0)
Alkaline Phosphatase: 30 U/L — ABNORMAL LOW (ref 38–126)
Anion gap: 9 (ref 5–15)
BUN: 8 mg/dL (ref 6–20)
CO2: 25 mmol/L (ref 22–32)
Calcium: 8.9 mg/dL (ref 8.9–10.3)
Chloride: 106 mmol/L (ref 98–111)
Creatinine, Ser: 0.63 mg/dL (ref 0.44–1.00)
GFR, Estimated: >60 mL/min (ref >60)
Glucose, Bld: 101 mg/dL — ABNORMAL HIGH (ref 70–99)
Potassium: 3.9 mmol/L (ref 3.5–5.1)
Sodium: 140 mmol/L (ref 135–145)
Total Bilirubin: 0.7 mg/dL (ref 0.3–1.2)
Total Protein: 6.7 g/dL (ref 6.5–8.1)

## 2020-10-17 LAB — LIPASE, BLOOD: Lipase: 20 U/L (ref 11–51)

## 2020-10-17 LAB — POC URINE PREG, ED: Preg Test, Ur: NEGATIVE

## 2020-10-17 MED ORDER — HYDROCODONE-ACETAMINOPHEN 5-325 MG PO TABS
1.0000 | ORAL_TABLET | Freq: Once | ORAL | Status: AC
  Administered 2020-10-17: 15:00:00 1 via ORAL
  Filled 2020-10-17: qty 1

## 2020-10-17 MED ORDER — ONDANSETRON HCL 4 MG/2ML IJ SOLN
4.0000 mg | Freq: Once | INTRAMUSCULAR | Status: AC
  Administered 2020-10-17: 13:00:00 4 mg via INTRAVENOUS
  Filled 2020-10-17: qty 2

## 2020-10-17 MED ORDER — KETOROLAC TROMETHAMINE 30 MG/ML IJ SOLN
15.0000 mg | Freq: Once | INTRAMUSCULAR | Status: AC
  Administered 2020-10-17: 13:00:00 15 mg via INTRAVENOUS
  Filled 2020-10-17: qty 1

## 2020-10-17 MED ORDER — MORPHINE SULFATE (PF) 4 MG/ML IV SOLN
4.0000 mg | Freq: Once | INTRAVENOUS | Status: AC
  Administered 2020-10-17: 4 mg via INTRAVENOUS
  Filled 2020-10-17: qty 1

## 2020-10-17 MED ORDER — IOHEXOL 300 MG/ML  SOLN
100.0000 mL | Freq: Once | INTRAMUSCULAR | Status: AC | PRN
  Administered 2020-10-17: 13:00:00 100 mL via INTRAVENOUS

## 2020-10-17 MED ORDER — CEPHALEXIN 500 MG PO CAPS: 500.0000 mg | ORAL_CAPSULE | Freq: Three times a day (TID) | ORAL | 0 refills | Status: AC

## 2020-10-17 MED ORDER — IBUPROFEN 600 MG PO TABS: 600.0000 mg | ORAL_TABLET | Freq: Three times a day (TID) | ORAL | 0 refills | Status: DC | PRN

## 2020-10-17 MED ORDER — SODIUM CHLORIDE 0.9 % IV BOLUS
1000.0000 mL | Freq: Once | INTRAVENOUS | Status: AC
  Administered 2020-10-17: 13:00:00 1000 mL via INTRAVENOUS

## 2020-10-17 MED ORDER — ONDANSETRON 4 MG PO TBDP: 4.0000 mg | ORAL_TABLET | Freq: Three times a day (TID) | ORAL | 0 refills | Status: DC | PRN

## 2020-10-17 NOTE — Discharge Instructions (Addendum)
As we discussed, birth control would likely help prevent this pain from occurring  Your urine did show a possible urinary tract infection so we will start an antibiotic to be safe

## 2020-10-17 NOTE — ED Triage Notes (Signed)
Pt c/o RLQ pain since late last night, early this morning. Denies N/V/d/Fever. p

## 2020-10-17 NOTE — ED Triage Notes (Signed)
First Nurse Note:  C/O RLQ abdominal pain.  Onset of symptoms this morning.  AAOx3.  Skin warm and dry. NAD

## 2020-10-18 NOTE — ED Provider Notes (Signed)
[facility] Emergency Department Provider Note  ____________________________________________   Event Date/Time   First MD Initiated Contact with Patient 10/17/20 1146     (approximate)  I have reviewed the triage vital signs and the nursing notes.   HISTORY  Chief Complaint Abdominal Pain    HPI [place] Dearing is a 24 y.o. female  Here with RLQ abd pain. Pt reports symptoms started recently with gradual onset of progressively worsening RLQ pain. The pain is constant, aching, intermittently cramping. She has an extensive h/o recurrent RLQ/R adnexal pain, and has been seen previously for this. Denies known h/o cysts. Pain is constant but worse with palpation. No alleviating factors. No fevers. No vaginal bleeding, discharge. No urinary urgency, frequency, hematuria.         Past Medical History:  Diagnosis Date  . Anemia   . Back pain   . Costochondritis     Patient Active Problem List   Diagnosis Date Noted  . Overweight 05/24/2015    Past Surgical History:  Procedure Laterality Date  . TONSILLECTOMY      Prior to Admission medications   Medication Sig Start Date End Date Taking? Authorizing Provider  cephALEXin (KEFLEX) 500 MG capsule Take 1 capsule (500 mg total) by mouth 3 (three) times daily for 5 days. 10/17/20 10/22/20  Shaune Pollack, MD  ferrous sulfate 325 (65 FE) MG tablet Take 325 mg by mouth daily with breakfast.    [provider]  ibuprofen (ADVIL) 600 MG tablet Take 1 tablet (600 mg total) by mouth every 8 (eight) hours as needed for moderate pain. 10/17/20   Shaune Pollack, MD  naproxen (NAPROSYN) 500 MG tablet Take 1 tablet (500 mg total) by mouth 2 (two) times daily with a meal. 12/24/19   Bridget Hartshorn L, PA-C  ondansetron (ZOFRAN ODT) 4 MG disintegrating tablet Take 1 tablet (4 mg total) by mouth every 8 (eight) hours as needed. 09/08/20   Jene Every, MD  ondansetron (ZOFRAN ODT) 4 MG disintegrating tablet  Take 1 tablet (4 mg total) by mouth every 8 (eight) hours as needed for nausea or vomiting. 10/17/20   Shaune Pollack, MD  traMADol (ULTRAM) 50 MG tablet Take 1 tablet (50 mg total) by mouth every 6 (six) hours as needed. 12/24/19   Tommi Rumps, PA-C    Allergies Watermelon [citrullus vulgaris]  Family History  Problem Relation Age of Onset  . Diabetes Mother   . Hypertension Mother   . Sarcoidosis Mother   . Alcohol abuse Maternal Grandmother   . Diabetes Maternal Grandmother   . Seizures Maternal Grandmother     Social History Social History   Tobacco Use  . Smoking status: Current Some Day Smoker    Packs/day: 1.00    Types: Cigars  . Smokeless tobacco: Never Used  Vaping Use  . Vaping Use: Never used  Substance Use Topics  . Alcohol use: No  . Drug use: No    Review of Systems  Review of Systems  Constitutional: Positive for fatigue. Negative for chills and fever.  HENT: Negative for sore throat.   Respiratory: Negative for shortness of breath.   Cardiovascular: Negative for chest pain.  Gastrointestinal: Positive for abdominal pain and nausea.  Genitourinary: Negative for flank pain.  Musculoskeletal: Negative for neck pain.  Skin: Negative for rash and wound.  Allergic/Immunologic: Negative for immunocompromised state.  Neurological: Negative for weakness and numbness.  Hematological: Does not bruise/bleed easily.     ____________________________________________  PHYSICAL  EXAM:      VITAL SIGNS: ED Triage Vitals  Enc Vitals Group     BP 10/17/20 0931 109/65     Pulse Rate 10/17/20 0930 72     Resp 10/17/20 0930 17     Temp 10/17/20 0930 98.9 F (37.2 C)     Temp Source 10/17/20 0930 Oral     SpO2 10/17/20 0930 99 %     Weight 10/17/20 0931 180 lb (81.6 kg)     Height 10/17/20 0931 5\' 6"  (1.676 m)     Head Circumference --      Peak Flow --      Pain Score 10/17/20 0931 10     Pain Loc --      Pain Edu? --      Excl. in GC? --       Physical Exam Vitals and nursing note reviewed.  Constitutional:      General: She is not in acute distress.    Appearance: She is well-developed.  HENT:     Head: Normocephalic and atraumatic.  Eyes:     Conjunctiva/sclera: Conjunctivae normal.  Cardiovascular:     Rate and Rhythm: Normal rate and regular rhythm.     Heart sounds: Normal heart sounds. No murmur heard. No friction rub.  Pulmonary:     Effort: Pulmonary effort is normal. No respiratory distress.     Breath sounds: Normal breath sounds. No wheezing or rales.  Abdominal:     General: There is no distension.     Palpations: Abdomen is soft.     Tenderness: There is abdominal tenderness in the right lower quadrant. There is no guarding or rebound.  Musculoskeletal:     Cervical back: Neck supple.  Skin:    General: Skin is warm.     Capillary Refill: Capillary refill takes less than 2 seconds.  Neurological:     Mental Status: She is alert and oriented to person, place, and time.     Motor: No abnormal muscle tone.       ____________________________________________   LABS (all labs ordered are listed, but only abnormal results are displayed)  Labs Reviewed  COMPREHENSIVE METABOLIC PANEL - Abnormal; Notable for the following components:      Result Value   Glucose, Bld 101 (*)    Alkaline Phosphatase 30 (*)    All other components within normal limits  CBC - Abnormal; Notable for the following components:   Platelets 133 (*)    All other components within normal limits  URINALYSIS, COMPLETE (UACMP) WITH MICROSCOPIC - Abnormal; Notable for the following components:   Color, Urine YELLOW (*)    APPearance CLEAR (*)    Leukocytes,Ua TRACE (*)    Bacteria, UA RARE (*)    All other components within normal limits  LIPASE, BLOOD  POC URINE PREG, ED    ____________________________________________  EKG:  ________________________________________  RADIOLOGY All imaging, including plain films, CT  scans, and ultrasounds, independently reviewed by me, and interpretations confirmed via formal radiology reads.  ED MD interpretation:   CT A/P: No acute abnormality, normal appendix, trace fluid in cul-de-sac  Official radiology report(s): No results found.  ____________________________________________  PROCEDURES   Procedure(s) performed (including Critical Care):  Procedures  ____________________________________________  INITIAL IMPRESSION / MDM / ASSESSMENT AND PLAN / ED COURSE  As part of my medical decision making, I reviewed the following data within the electronic MEDICAL RECORD NUMBER Nursing notes reviewed and incorporated, Old chart reviewed, Notes from  prior ED visits, and [place] Controlled Substance Database       *Panama Fortenberry was evaluated in Emergency Department on 10/18/2020 for the symptoms described in the history of present illness. She was evaluated in the context of the global COVID-19 pandemic, which necessitated consideration that the patient might be at risk for infection with the SARS-CoV-2 virus that causes COVID-19. Institutional protocols and algorithms that pertain to the evaluation of patients at risk for COVID-19 are in a state of rapid change based on information released by regulatory bodies including the CDC and federal and state organizations. These policies and algorithms were followed during the patient's care in the ED.  Some ED evaluations and interventions may be delayed as a result of limited staffing during the pandemic.*     Medical Decision Making:  24 yo F here with mild RLQ pain. Labs are overall reassuring with normal WBC, CMP without signs of hepatitis or AKI. Lipase is normal. Given that initial DDx includes appendicitis, colitis, cyst, CT obtained and shows no evidence of abnormality. She does have trace free fluid, likely physiologic. UA shows mild pyuria as well. No flank pain, vomiting, or signs of pyelo. Suspect UTI vs possibly pain  related to ovulation vs ruptured cyst. Pain and imaging, exam is not consistent with torsion. Will treat with keflex, analgesia, outpt follow-up.  ____________________________________________  FINAL CLINICAL IMPRESSION(S) / ED DIAGNOSES  Final diagnoses:  Right lower quadrant abdominal pain  Acute cystitis without hematuria     MEDICATIONS GIVEN DURING THIS VISIT:  Medications  morphine 4 MG/ML injection 4 mg (4 mg Intravenous Given 10/17/20 1257)  ondansetron (ZOFRAN) injection 4 mg (4 mg Intravenous Given 10/17/20 1257)  ketorolac (TORADOL) 30 MG/ML injection 15 mg (15 mg Intravenous Given 10/17/20 1257)  sodium chloride 0.9 % bolus 1,000 mL (0 mLs Intravenous Stopped 10/17/20 1508)  iohexol (OMNIPAQUE) 300 MG/ML solution 100 mL (100 mLs Intravenous Contrast Given 10/17/20 1310)  HYDROcodone-acetaminophen (NORCO/VICODIN) 5-325 MG per tablet 1 tablet (1 tablet Oral Given 10/17/20 1524)     ED Discharge Orders         Ordered    ibuprofen (ADVIL) 600 MG tablet  Every 8 hours PRN        10/17/20 1443    ondansetron (ZOFRAN ODT) 4 MG disintegrating tablet  Every 8 hours PRN        10/17/20 1443    cephALEXin (KEFLEX) 500 MG capsule  3 times daily        10/17/20 1443           Note:  This document was prepared using Dragon voice recognition software and may include unintentional dictation errors.   Shaune Pollack, MD 10/18/20 1710

## 2020-11-08 ENCOUNTER — Emergency Department
Admission: EM | Admit: 2020-11-08 | Discharge: 2020-11-08 | Disposition: A | Discharge status: Home / Self Care | Payer: Self-pay | Attending: Emergency Medicine | Admitting: Emergency Medicine

## 2020-11-08 ENCOUNTER — Other Ambulatory Visit: Payer: Self-pay

## 2020-11-08 DIAGNOSIS — R1030 Lower abdominal pain, unspecified: Secondary | ICD-10-CM | POA: Insufficient documentation

## 2020-11-08 DIAGNOSIS — F17210 Nicotine dependence, cigarettes, uncomplicated: Secondary | ICD-10-CM | POA: Insufficient documentation

## 2020-11-08 LAB — CBC
HCT: 41.1 % (ref 36.0–46.0)
Hemoglobin: 13 g/dL (ref 12.0–15.0)
MCH: 26.9 pg (ref 26.0–34.0)
MCHC: 31.6 g/dL (ref 30.0–36.0)
MCV: 84.9 fL (ref 80.0–100.0)
Platelets: 152 10*3/uL (ref 150–400)
RBC: 4.84 MIL/uL (ref 3.87–5.11)
RDW: 12.7 % (ref 11.5–15.5)
WBC: 8 10*3/uL (ref 4.0–10.5)
nRBC: 0 % (ref 0.0–0.2)

## 2020-11-08 LAB — URINALYSIS, COMPLETE (UACMP) WITH MICROSCOPIC
Bacteria, UA: NONE SEEN
Bilirubin Urine: NEGATIVE
Glucose, UA: NEGATIVE mg/dL
Hgb urine dipstick: NEGATIVE
Ketones, ur: NEGATIVE mg/dL
Nitrite: NEGATIVE
Protein, ur: NEGATIVE mg/dL
Specific Gravity, Urine: 1.025 (ref 1.005–1.030)
pH: 6 (ref 5.0–8.0)

## 2020-11-08 LAB — COMPREHENSIVE METABOLIC PANEL
ALT: 6 U/L (ref 0–44)
AST: 16 U/L (ref 15–41)
Albumin: 4 g/dL (ref 3.5–5.0)
Alkaline Phosphatase: 32 U/L — ABNORMAL LOW (ref 38–126)
Anion gap: 8 (ref 5–15)
BUN: 12 mg/dL (ref 6–20)
CO2: 27 mmol/L (ref 22–32)
Calcium: 8.9 mg/dL (ref 8.9–10.3)
Chloride: 103 mmol/L (ref 98–111)
Creatinine, Ser: 0.66 mg/dL (ref 0.44–1.00)
GFR, Estimated: >60 mL/min (ref >60)
Glucose, Bld: 99 mg/dL (ref 70–99)
Potassium: 4 mmol/L (ref 3.5–5.1)
Sodium: 138 mmol/L (ref 135–145)
Total Bilirubin: 0.5 mg/dL (ref 0.3–1.2)
Total Protein: 6.9 g/dL (ref 6.5–8.1)

## 2020-11-08 LAB — POC URINE PREG, ED: Preg Test, Ur: NEGATIVE

## 2020-11-08 LAB — LIPASE, BLOOD: Lipase: 25 U/L (ref 11–51)

## 2020-11-08 MED ORDER — KETOROLAC TROMETHAMINE 30 MG/ML IJ SOLN
30.0000 mg | Freq: Once | INTRAMUSCULAR | Status: AC
  Administered 2020-11-08: 30 mg via INTRAMUSCULAR
  Filled 2020-11-08: qty 1

## 2020-11-08 MED ORDER — TRAMADOL HCL 50 MG PO TABS: 50.0000 mg | ORAL_TABLET | Freq: Four times a day (QID) | ORAL | 0 refills | Status: DC | PRN

## 2020-11-08 NOTE — ED Provider Notes (Signed)
[facility] Emergency Department Provider Note   ____________________________________________    I have reviewed the triage vital signs and the nursing notes.   HISTORY  Chief Complaint Abdominal Pain     HPI Michelle Washington is a 25 y.o. female who presents with complaints of lower abdominal cramping.  Patient reports this is a frequent problem for her.  She was seen on December 21st with similar symptoms at that time had a normal CT scan.  She denies dysuria, no vaginal discharge.  Reports the pain was worse this morning but has now improved significantly, does not take anything for it.  Denies fevers chills nausea or vomiting.  No radiation.  Past Medical History:  Diagnosis Date  . Anemia   . Back pain   . Costochondritis     Patient Active Problem List   Diagnosis Date Noted  . Overweight 05/24/2015    Past Surgical History:  Procedure Laterality Date  . TONSILLECTOMY      Prior to Admission medications   Medication Sig Start Date End Date Taking? Authorizing Provider  traMADol (ULTRAM) 50 MG tablet Take 1 tablet (50 mg total) by mouth every 6 (six) hours as needed. 11/08/20 11/08/21 Yes Jene Every, MD  ferrous sulfate 325 (65 FE) MG tablet Take 325 mg by mouth daily with breakfast.    [provider]  ibuprofen (ADVIL) 600 MG tablet Take 1 tablet (600 mg total) by mouth every 8 (eight) hours as needed for moderate pain. 10/17/20   Shaune Pollack, MD  naproxen (NAPROSYN) 500 MG tablet Take 1 tablet (500 mg total) by mouth 2 (two) times daily with a meal. 12/24/19   Bridget Hartshorn L, PA-C  ondansetron (ZOFRAN ODT) 4 MG disintegrating tablet Take 1 tablet (4 mg total) by mouth every 8 (eight) hours as needed. 09/08/20   Jene Every, MD  ondansetron (ZOFRAN ODT) 4 MG disintegrating tablet Take 1 tablet (4 mg total) by mouth every 8 (eight) hours as needed for nausea or vomiting. 10/17/20   Shaune Pollack, MD      Allergies Watermelon [citrullus vulgaris]  Family History  Problem Relation Age of Onset  . Diabetes Mother   . Hypertension Mother   . Sarcoidosis Mother   . Alcohol abuse Maternal Grandmother   . Diabetes Maternal Grandmother   . Seizures Maternal Grandmother     Social History Social History   Tobacco Use  . Smoking status: Current Some Day Smoker    Packs/day: 1.00    Types: Cigars  . Smokeless tobacco: Never Used  Vaping Use  . Vaping Use: Never used  Substance Use Topics  . Alcohol use: No  . Drug use: No    Review of Systems  Constitutional: No fever/chills Eyes: No visual changes.  ENT: No sore throat. Cardiovascular: Denies chest pain. Respiratory: Denies shortness of breath. Gastrointestinal: As above Genitourinary: As above Musculoskeletal: Negative for back pain. Skin: Negative for rash. Neurological: Negative for headaches or weakness   ____________________________________________   PHYSICAL EXAM:  VITAL SIGNS: ED Triage Vitals  Enc Vitals Group     BP 11/08/20 0845 120/81     Pulse Rate 11/08/20 0845 94     Resp 11/08/20 0845 16     Temp 11/08/20 0845 98.5 F (36.9 C)     Temp Source 11/08/20 0845 Oral     SpO2 11/08/20 0845 99 %     Weight 11/08/20 0846 81.6 kg (180 lb)     Height 11/08/20  0846 1.676 m (5\' 6" )     Head Circumference --      Peak Flow --      Pain Score 11/08/20 0840 6     Pain Loc --      Pain Edu? --      Excl. in GC? --     Constitutional: Alert and oriented. No acute distress. Pleasant and interactive  Nose: No congestion/rhinnorhea. Mouth/Throat: Mucous membranes are moist.    Cardiovascular: Normal rate, regular rhythm. Grossly normal heart sounds.  Good peripheral circulation. Respiratory: Normal respiratory effort.  No retractions. Lungs CTAB. Gastrointestinal: Soft and nontender. No distention.  No CVA tenderness.  Reassuring exam Genitourinary: deferred Musculoskeletal  Warm and well  perfused Neurologic:  Normal speech and language. No gross focal neurologic deficits are appreciated.  Skin:  Skin is warm, dry and intact. No rash noted. Psychiatric: Mood and affect are normal. Speech and behavior are normal.  ____________________________________________   LABS (all labs ordered are listed, but only abnormal results are displayed)  Labs Reviewed  COMPREHENSIVE METABOLIC PANEL - Abnormal; Notable for the following components:      Result Value   Alkaline Phosphatase 32 (*)    All other components within normal limits  URINALYSIS, COMPLETE (UACMP) WITH MICROSCOPIC - Abnormal; Notable for the following components:   Color, Urine YELLOW (*)    APPearance HAZY (*)    Leukocytes,Ua MODERATE (*)    All other components within normal limits  LIPASE, BLOOD  CBC  POC URINE PREG, ED   ____________________________________________  EKG   ____________________________________________  RADIOLOGY   ____________________________________________   PROCEDURES  Procedure(s) performed: No  Procedures   Critical Care performed: No ____________________________________________   INITIAL IMPRESSION / ASSESSMENT AND PLAN / ED COURSE  Pertinent labs & imaging results that were available during my care of the patient were reviewed by me and considered in my medical decision making (see chart for details).  Patient well-appearing in no acute distress.  Lower abdominal pain as described above which has significantly improved from this morning.  Lab work is quite reassuring, normal white blood cell count.  Urinalysis with moderate leuks however patient is asymptomatic, not consistent with urinary tract infection.  No vaginal discharge.  Given that she is improved significantly and that this is been ongoing for quite some time will refer to gynecology, analgesics provided, patient agrees with plan    ____________________________________________   FINAL CLINICAL  IMPRESSION(S) / ED DIAGNOSES  Final diagnoses:  Lower abdominal pain        Note:  This document was prepared using Dragon voice recognition software and may include unintentional dictation errors.   01/06/21, MD 11/08/20 1340

## 2020-11-08 NOTE — ED Triage Notes (Signed)
Pt comes via POV from home with c/o abdominal pain, N/V. Pt states she was seen recently for same but it has gotten worse.

## 2020-11-22 ENCOUNTER — Other Ambulatory Visit: Payer: Self-pay

## 2020-11-22 DIAGNOSIS — Z5321 Procedure and treatment not carried out due to patient leaving prior to being seen by health care provider: Secondary | ICD-10-CM | POA: Insufficient documentation

## 2020-11-22 DIAGNOSIS — J029 Acute pharyngitis, unspecified: Secondary | ICD-10-CM | POA: Insufficient documentation

## 2020-11-22 LAB — GROUP A STREP BY PCR: Group A Strep by PCR: NOT DETECTED

## 2020-11-22 NOTE — ED Triage Notes (Signed)
Pt in with co sore throat since yesterday  

## 2020-11-23 ENCOUNTER — Emergency Department
Admission: EM | Admit: 2020-11-23 | Discharge: 2020-11-23 | Disposition: A | Discharge status: Home / Self Care | Payer: Self-pay | Attending: Emergency Medicine | Admitting: Emergency Medicine

## 2020-11-23 NOTE — ED Notes (Signed)
Called pt at this time with no answer.

## 2020-11-23 NOTE — ED Notes (Signed)
No answer when called several times from lobby 

## 2020-11-23 NOTE — ED Notes (Signed)
No answer when called several times from lobby & from cell phone # listed in chart 

## 2021-01-26 ENCOUNTER — Other Ambulatory Visit: Payer: Self-pay

## 2021-01-26 ENCOUNTER — Emergency Department
Admission: EM | Admit: 2021-01-26 | Discharge: 2021-01-26 | Disposition: A | Discharge status: Home / Self Care | Payer: Self-pay | Attending: Emergency Medicine | Admitting: Emergency Medicine

## 2021-01-26 DIAGNOSIS — J309 Allergic rhinitis, unspecified: Secondary | ICD-10-CM | POA: Insufficient documentation

## 2021-01-26 DIAGNOSIS — Z20822 Contact with and (suspected) exposure to covid-19: Secondary | ICD-10-CM | POA: Insufficient documentation

## 2021-01-26 DIAGNOSIS — F17210 Nicotine dependence, cigarettes, uncomplicated: Secondary | ICD-10-CM | POA: Insufficient documentation

## 2021-01-26 DIAGNOSIS — R0982 Postnasal drip: Secondary | ICD-10-CM | POA: Insufficient documentation

## 2021-01-26 LAB — GROUP A STREP BY PCR: Group A Strep by PCR: NOT DETECTED

## 2021-01-26 MED ORDER — AMOXICILLIN-POT CLAVULANATE 875-125 MG PO TABS: 1.0000 | ORAL_TABLET | Freq: Two times a day (BID) | ORAL | 0 refills | Status: DC

## 2021-01-26 MED ORDER — FLUTICASONE PROPIONATE 50 MCG/ACT NA SUSP: 1.0000 | Freq: Two times a day (BID) | NASAL | 0 refills | Status: DC

## 2021-01-26 NOTE — ED Provider Notes (Signed)
[facility] Emergency Department Provider Note  ____________________________________________  Time seen: Approximately 9:52 PM  I have reviewed the triage vital signs and the nursing notes.   HISTORY  Chief Complaint Sore Throat    HPI Michelle Washington is a 25 y.o. female who presents the emergency department complaining of nasal congestion, sinus pressure, sore throat.  Patient states that she was around a family that was sick recently and went to make sure that she did not have strep.  Patient thinks that it might be her sinuses as she has had a sinus headache, increased nasal congestion and she believes that she has had some postnasal drip.  Patient denies any cough, fevers or chills, difficulty breathing.  Patient states that she has taken allergy medicines in the past but is not currently taking any.         Past Medical History:  Diagnosis Date  . Anemia   . Back pain   . Costochondritis     Patient Active Problem List   Diagnosis Date Noted  . Overweight 05/24/2015    Past Surgical History:  Procedure Laterality Date  . TONSILLECTOMY      Prior to Admission medications   Medication Sig Start Date End Date Taking? Authorizing Provider  amoxicillin-clavulanate (AUGMENTIN) 875-125 MG tablet Take 1 tablet by mouth 2 (two) times daily. 01/26/21  Yes Jekhi Bolin, Delorise Royals, PA-C  fluticasone (FLONASE) 50 MCG/ACT nasal spray Place 1 spray into both nostrils 2 (two) times daily. 01/26/21  Yes Seryna Marek, Delorise Royals, PA-C  ferrous sulfate 325 (65 FE) MG tablet Take 325 mg by mouth daily with breakfast.    [provider]  ibuprofen (ADVIL) 600 MG tablet Take 1 tablet (600 mg total) by mouth every 8 (eight) hours as needed for moderate pain. 10/17/20   Shaune Pollack, MD  naproxen (NAPROSYN) 500 MG tablet Take 1 tablet (500 mg total) by mouth 2 (two) times daily with a meal. 12/24/19   Bridget Hartshorn L, PA-C  ondansetron (ZOFRAN ODT) 4 MG  disintegrating tablet Take 1 tablet (4 mg total) by mouth every 8 (eight) hours as needed. 09/08/20   Jene Every, MD  ondansetron (ZOFRAN ODT) 4 MG disintegrating tablet Take 1 tablet (4 mg total) by mouth every 8 (eight) hours as needed for nausea or vomiting. 10/17/20   Shaune Pollack, MD  traMADol (ULTRAM) 50 MG tablet Take 1 tablet (50 mg total) by mouth every 6 (six) hours as needed. 11/08/20 11/08/21  Jene Every, MD    Allergies Watermelon [citrullus vulgaris]  Family History  Problem Relation Age of Onset  . Diabetes Mother   . Hypertension Mother   . Sarcoidosis Mother   . Alcohol abuse Maternal Grandmother   . Diabetes Maternal Grandmother   . Seizures Maternal Grandmother     Social History Social History   Tobacco Use  . Smoking status: Current Some Day Smoker    Packs/day: 1.00    Types: Cigars  . Smokeless tobacco: Never Used  Vaping Use  . Vaping Use: Never used  Substance Use Topics  . Alcohol use: No  . Drug use: No     Review of Systems  Constitutional: No fever/chills Eyes: No visual changes. No discharge ENT: Sinus congestion, postnasal drip, sore throat Cardiovascular: no chest pain. Respiratory: no cough. No SOB. Gastrointestinal: No abdominal pain.  No nausea, no vomiting.  No diarrhea.  No constipation. Musculoskeletal: Negative for musculoskeletal pain. Skin: Negative for rash, abrasions, lacerations, ecchymosis. Neurological: Negative for  headaches, focal weakness or numbness.  10 System ROS otherwise negative.  ____________________________________________   PHYSICAL EXAM:  VITAL SIGNS: ED Triage Vitals  Enc Vitals Group     BP 01/26/21 2135 140/76     Pulse Rate 01/26/21 2135 88     Resp 01/26/21 2135 18     Temp 01/26/21 2135 98.5 F (36.9 C)     Temp Source 01/26/21 2135 Oral     SpO2 01/26/21 2135 98 %     Weight 01/26/21 2136 200 lb (90.7 kg)     Height 01/26/21 2136 5\' 6"  (1.676 m)     Head Circumference --      Peak  Flow --      Pain Score 01/26/21 2139 8     Pain Loc --      Pain Edu? --      Excl. in GC? --      Constitutional: Alert and oriented. Well appearing and in no acute distress. Eyes: Conjunctivae are normal. PERRL. EOMI. Head: Atraumatic. ENT:      Ears:       Nose: Moderate congestion/rhinnorhea.  Minimal tenderness over the maxillary sinuses to percussion.      Mouth/Throat: Mucous membranes are moist.  Oropharynx is minimally erythematous but nonedematous.  No exudates.  Tonsils are not hypertrophied.  Uvula is midline. Neck: No stridor.   Hematological/Lymphatic/Immunilogical: No cervical lymphadenopathy.  Cardiovascular: Normal rate, regular rhythm. Normal S1 and S2.  Good peripheral circulation. Respiratory: Normal respiratory effort without tachypnea or retractions. Lungs CTAB. Good air entry to the bases with no decreased or absent breath sounds. Musculoskeletal: Full range of motion to all extremities. No gross deformities appreciated. Neurologic:  Normal speech and language. No gross focal neurologic deficits are appreciated.  Skin:  Skin is warm, dry and intact. No rash noted. Psychiatric: Mood and affect are normal. Speech and behavior are normal. Patient exhibits appropriate insight and judgement.   ____________________________________________   LABS (all labs ordered are listed, but only abnormal results are displayed)  Labs Reviewed  GROUP A STREP BY PCR  SARS CORONAVIRUS 2 (TAT 6-24 HRS)   ____________________________________________  EKG   ____________________________________________  RADIOLOGY   No results found.  ____________________________________________    PROCEDURES  Procedure(s) performed:    Procedures    Medications - No data to display   ____________________________________________   INITIAL IMPRESSION / ASSESSMENT AND PLAN / ED COURSE  Pertinent labs & imaging results that were available during my care of the patient were  reviewed by me and considered in my medical decision making (see chart for details).  Review of the [place] CSRS was performed in accordance of the [facility] prior to dispensing any controlled drugs.           Patient's diagnosis is consistent with allergic rhinitis with postnasal drip.  Patient presented to the emergency department complaining of nasal congestion, sinus pressure, postnasal drip with sore throat.  She is recently around a sick family member and want to make sure that she has not developed strep or Covid.  Covid test and strep test were performed but patient will be discharged prior to return results.  She will follow up the results on MyChart.  Given the physical exam I feel that this is likely allergic rhinitis with postnasal drip causing the remainder of the symptoms.  I will write a prescription for antibiotics that will cover both strep and sinusitis should her strep test return positive or her nasal symptoms/sinus pressure worsen.  Otherwise Zyrtec and Flonase at home for symptom relief.  Follow-up primary care as needed. Patient is given ED precautions to return to the ED for any worsening or new symptoms.     ____________________________________________  FINAL CLINICAL IMPRESSION(S) / ED DIAGNOSES  Final diagnoses:  Allergic rhinitis, unspecified seasonality, unspecified trigger  Post-nasal drip      NEW MEDICATIONS STARTED DURING THIS VISIT:  ED Discharge Orders         Ordered    fluticasone (FLONASE) 50 MCG/ACT nasal spray  2 times daily        01/26/21 2210    amoxicillin-clavulanate (AUGMENTIN) 875-125 MG tablet  2 times daily        01/26/21 2210              This chart was dictated using voice recognition software/Dragon. Despite best efforts to proofread, errors can occur which can change the meaning. Any change was purely unintentional.    Racheal Patches, PA-C 01/26/21 2210    Chesley Noon, MD 01/26/21 2317

## 2021-01-26 NOTE — ED Triage Notes (Signed)
Pt in with co sore throat for few days

## 2021-01-27 LAB — SARS CORONAVIRUS 2 (TAT 6-24 HRS): SARS Coronavirus 2: NEGATIVE

## 2021-03-07 ENCOUNTER — Other Ambulatory Visit: Payer: Self-pay

## 2021-03-07 ENCOUNTER — Emergency Department
Admission: EM | Admit: 2021-03-07 | Discharge: 2021-03-07 | Disposition: A | Discharge status: Home / Self Care | Payer: No Typology Code available for payment source | Attending: Emergency Medicine | Admitting: Emergency Medicine

## 2021-03-07 ENCOUNTER — Encounter: Payer: Self-pay | Admitting: Emergency Medicine

## 2021-03-07 ENCOUNTER — Emergency Department: Payer: No Typology Code available for payment source

## 2021-03-07 DIAGNOSIS — K0889 Other specified disorders of teeth and supporting structures: Secondary | ICD-10-CM | POA: Insufficient documentation

## 2021-03-07 DIAGNOSIS — F17290 Nicotine dependence, other tobacco product, uncomplicated: Secondary | ICD-10-CM | POA: Insufficient documentation

## 2021-03-07 DIAGNOSIS — R1032 Left lower quadrant pain: Secondary | ICD-10-CM | POA: Diagnosis not present

## 2021-03-07 DIAGNOSIS — N390 Urinary tract infection, site not specified: Secondary | ICD-10-CM | POA: Diagnosis not present

## 2021-03-07 DIAGNOSIS — K1379 Other lesions of oral mucosa: Secondary | ICD-10-CM

## 2021-03-07 DIAGNOSIS — K5900 Constipation, unspecified: Secondary | ICD-10-CM | POA: Diagnosis not present

## 2021-03-07 LAB — URINALYSIS, COMPLETE (UACMP) WITH MICROSCOPIC
Bilirubin Urine: NEGATIVE
Glucose, UA: NEGATIVE mg/dL
Ketones, ur: NEGATIVE mg/dL
Nitrite: NEGATIVE
Protein, ur: NEGATIVE mg/dL
Specific Gravity, Urine: 1.016 (ref 1.005–1.030)
pH: 6 (ref 5.0–8.0)

## 2021-03-07 LAB — CBC
HCT: 36.7 % (ref 36.0–46.0)
Hemoglobin: 11.3 g/dL — ABNORMAL LOW (ref 12.0–15.0)
MCH: 25.8 pg — ABNORMAL LOW (ref 26.0–34.0)
MCHC: 30.8 g/dL (ref 30.0–36.0)
MCV: 83.8 fL (ref 80.0–100.0)
Platelets: 165 10*3/uL (ref 150–400)
RBC: 4.38 MIL/uL (ref 3.87–5.11)
RDW: 13.1 % (ref 11.5–15.5)
WBC: 8.8 10*3/uL (ref 4.0–10.5)
nRBC: 0 % (ref 0.0–0.2)

## 2021-03-07 LAB — COMPREHENSIVE METABOLIC PANEL
ALT: 8 U/L (ref 0–44)
AST: 17 U/L (ref 15–41)
Albumin: 3.3 g/dL — ABNORMAL LOW (ref 3.5–5.0)
Alkaline Phosphatase: 27 U/L — ABNORMAL LOW (ref 38–126)
Anion gap: 7 (ref 5–15)
BUN: 9 mg/dL (ref 6–20)
CO2: 23 mmol/L (ref 22–32)
Calcium: 8.2 mg/dL — ABNORMAL LOW (ref 8.9–10.3)
Chloride: 107 mmol/L (ref 98–111)
Creatinine, Ser: 0.61 mg/dL (ref 0.44–1.00)
GFR, Estimated: >60 mL/min (ref >60)
Glucose, Bld: 99 mg/dL (ref 70–99)
Potassium: 3.9 mmol/L (ref 3.5–5.1)
Sodium: 137 mmol/L (ref 135–145)
Total Bilirubin: 0.9 mg/dL (ref 0.3–1.2)
Total Protein: 5.7 g/dL — ABNORMAL LOW (ref 6.5–8.1)

## 2021-03-07 LAB — LIPASE, BLOOD: Lipase: 25 U/L (ref 11–51)

## 2021-03-07 LAB — POC URINE PREG, ED: Preg Test, Ur: NEGATIVE

## 2021-03-07 MED ORDER — MAGIC MOUTHWASH: 10.0000 mL | Freq: Four times a day (QID) | ORAL | 0 refills | Status: DC

## 2021-03-07 MED ORDER — CEPHALEXIN 500 MG PO CAPS: 500.0000 mg | ORAL_CAPSULE | Freq: Three times a day (TID) | ORAL | 0 refills | Status: DC

## 2021-03-07 MED ORDER — LIDOCAINE VISCOUS HCL 2 % MT SOLN
15.0000 mL | Freq: Once | OROMUCOSAL | Status: AC
  Administered 2021-03-07: 15 mL via OROMUCOSAL
  Filled 2021-03-07: qty 15

## 2021-03-07 NOTE — ED Triage Notes (Signed)
Pt here with LLQ abd pain that started 2 days ago. Pt also c/o of sore on gums. Pt has constipation as well.

## 2021-03-07 NOTE — Discharge Instructions (Addendum)
Follow-up with your primary care provider if any continued problems or concerns.  A prescription for mouthwash was printed for you to take to your pharmacy to be mixed up.  This is to swish around in your mouth which should help with the sores that you have.  Most likely this is a viral illness.  A prescription for Keflex 500 mg 3 times daily for 10 days was sent to the pharmacy for your urinary tract infection.  While you are at the pharmacy you will need to pick up an over-the-counter medication called Dulcolax tablets.  Take these as directed and also drink lots of water with this which should take care of your constipation.  You may take Tylenol as needed for your mouth pain.

## 2021-03-07 NOTE — ED Provider Notes (Signed)
[facility] Emergency Department Provider Note   ____________________________________________   Event Date/Time   First MD Initiated Contact with Patient 03/07/21 1340     (approximate)  I have reviewed the triage vital signs and the nursing notes.   HISTORY  Chief Complaint Abdominal Pain   HPI Michelle Washington is a 25 y.o. female   Presents to the ED with multiple complaints.  Patient states that she has left lower quadrant pain that started 2 days ago.  She also has sores on her gums and complains of constipation.  Patient states that she has not taken any over-the-counter medication for the constipation or for the pain with her gums.  She denies any fever, chills, nausea or vomiting.  There has been no diarrhea.  She states that eating is a challenge due to her sore gums.  She rates her pain as an 8 out of 10.    Past Medical History:  Diagnosis Date  . Anemia   . Back pain   . Costochondritis     Patient Active Problem List   Diagnosis Date Noted  . Overweight 05/24/2015    Past Surgical History:  Procedure Laterality Date  . TONSILLECTOMY      Prior to Admission medications   Medication Sig Start Date End Date Taking? Authorizing Provider  cephALEXin (KEFLEX) 500 MG capsule Take 1 capsule (500 mg total) by mouth 3 (three) times daily. 03/07/21  Yes Bridget Hartshorn L, PA-C  magic mouthwash SOLN Take 10 mLs by mouth 4 (four) times daily. 03/07/21  Yes Bridget Hartshorn L, PA-C  ferrous sulfate 325 (65 FE) MG tablet Take 325 mg by mouth daily with breakfast.    [provider]  fluticasone (FLONASE) 50 MCG/ACT nasal spray Place 1 spray into both nostrils 2 (two) times daily. 01/26/21   Cuthriell, Delorise Royals, PA-C    Allergies Watermelon [citrullus vulgaris]  Family History  Problem Relation Age of Onset  . Diabetes Mother   . Hypertension Mother   . Sarcoidosis Mother   . Alcohol abuse Maternal Grandmother   . Diabetes Maternal  Grandmother   . Seizures Maternal Grandmother     Social History Social History   Tobacco Use  . Smoking status: Current Some Day Smoker    Packs/day: 1.00    Types: Cigars  . Smokeless tobacco: Never Used  Vaping Use  . Vaping Use: Never used  Substance Use Topics  . Alcohol use: No  . Drug use: No    Review of Systems Constitutional: No fever/chills Eyes: No visual changes. ENT: No sore throat.  Positive for sores on her gums. Cardiovascular: Denies chest pain. Respiratory: Denies shortness of breath. Gastrointestinal: Positive left lower quadrant pain.  No nausea, no vomiting.  No diarrhea.  Positive constipation. Genitourinary: Negative for dysuria.  Negative for stones. Musculoskeletal: Negative for back pain. Skin: Negative for rash. Neurological: Negative for headaches, focal weakness or numbness. ____________________________________________   PHYSICAL EXAM:  VITAL SIGNS: ED Triage Vitals  Enc Vitals Group     BP 03/07/21 1229 111/74     Pulse Rate 03/07/21 1227 90     Resp 03/07/21 1227 18     Temp 03/07/21 1227 98.4 F (36.9 C)     Temp Source 03/07/21 1227 Oral     SpO2 03/07/21 1227 100 %     Weight 03/07/21 1228 205 lb (93 kg)     Height 03/07/21 1228 5\' 6"  (1.676 m)     Head Circumference --  Peak Flow --      Pain Score 03/07/21 1228 8     Pain Loc --      Pain Edu? --      Excl. in GC? --     Constitutional: Alert and oriented. Well appearing and in no acute distress. Eyes: Conjunctivae are normal. PERRL. EOMI. Head: Atraumatic. Nose: No congestion/rhinnorhea. Mouth/Throat: Mucous membranes are moist.  Oropharynx non-erythematous.  No active exudate is noted.  There are a few erythematous areas on the gum that are tender to palpation with a tongue depressor.  No purulent drainage or abscesses are noted. Neck: No stridor.   Cardiovascular: Normal rate, regular rhythm. Grossly normal heart sounds.  Good peripheral  circulation. Respiratory: Normal respiratory effort.  No retractions. Lungs CTAB. Gastrointestinal: Soft and nontender. No distention.  Bowel sounds normoactive x4 quadrants.  No rebound tenderness is noted. Musculoskeletal: His upper and lower extremities without any difficulty.  No edema noted lower extremities. Neurologic:  Normal speech and language. No gross focal neurologic deficits are appreciated. No gait instability. Skin:  Skin is warm, dry and intact. No rash noted. Psychiatric: Mood and affect are normal. Speech and behavior are normal.  ____________________________________________   LABS (all labs ordered are listed, but only abnormal results are displayed)  Labs Reviewed  COMPREHENSIVE METABOLIC PANEL - Abnormal; Notable for the following components:      Result Value   Calcium 8.2 (*)    Total Protein 5.7 (*)    Albumin 3.3 (*)    Alkaline Phosphatase 27 (*)    All other components within normal limits  CBC - Abnormal; Notable for the following components:   Hemoglobin 11.3 (*)    MCH 25.8 (*)    All other components within normal limits  URINALYSIS, COMPLETE (UACMP) WITH MICROSCOPIC - Abnormal; Notable for the following components:   Color, Urine YELLOW (*)    APPearance CLEAR (*)    Hgb urine dipstick MODERATE (*)    Leukocytes,Ua SMALL (*)    Bacteria, UA RARE (*)    All other components within normal limits  LIPASE, BLOOD  POC URINE PREG, ED   ____________________________________________ __________________________________________  RADIOLOGY Beaulah Corin, personally viewed and evaluated these images (plain radiographs) as part of my medical decision making, as well as reviewing the written report by the radiologist.   Official radiology report(s): DG Abdomen 1 View  Result Date: 03/07/2021 CLINICAL DATA:  Right lower quadrant pain for 2 days.  Constipation. EXAM: ABDOMEN - 1 VIEW COMPARISON:  CT, 10/17/2020. FINDINGS: No evidence of renal or  ureteral stones. Normal bowel gas pattern.  Mild colonic stool burden. Soft tissues are unremarkable.  Normal skeletal structures. IMPRESSION: 1. No acute findings. No evidence of renal or ureteral stones. No bowel obstruction. 2. Mild colonic stool burden. Electronically Signed   By: Amie Portland M.D.   On: 03/07/2021 14:35    ____________________________________________   PROCEDURES  Procedure(s) performed (including Critical Care):  Procedures   ____________________________________________   INITIAL IMPRESSION / ASSESSMENT AND PLAN / ED COURSE  As part of my medical decision making, I reviewed the following data within the electronic MEDICAL RECORD NUMBER Notes from prior ED visits and [place] Controlled Substance Database  25 year old female presents to the ED with multiple complaints starting with left lower quadrant pain that started 2 days ago, sores on her gums and constipation.  She was given viscous lidocaine while in the ED and states that this is helped with the pain in  her mouth.  1 view abdomen showed mild constipation.  Left lower quadrant is nontender to palpation however urinalysis does show WBCs and most likely is a urinary tract infection.  Patient was discharged with a prescription for Magic mouthwash and Keflex.  She also was given instructions to pick up Dulcolax tablets while at the pharmacy picking up her medications.  She is to increase fluids and fiber to help with her constipation.  She is to follow-up with her PCP if any continued problems.  ____________________________________________   FINAL CLINICAL IMPRESSION(S) / ED DIAGNOSES  Final diagnoses:  Mouth pain  Constipation, unspecified constipation type  Urinary tract infection without hematuria, site unspecified     ED Discharge Orders         Ordered    magic mouthwash SOLN  4 times daily       Note to Pharmacy: Diphenhydramine HCL - Alum & Mag hydroxide-simeth, nystatin mix to equal 100 ML Swish and spit  qid   03/07/21 1504    cephALEXin (KEFLEX) 500 MG capsule  3 times daily        03/07/21 1504          *Please note:  Michelle Kail was evaluated in Emergency Department on 03/07/2021 for the symptoms described in the history of present illness. She was evaluated in the context of the global COVID-19 pandemic, which necessitated consideration that the patient might be at risk for infection with the SARS-CoV-2 virus that causes COVID-19. Institutional protocols and algorithms that pertain to the evaluation of patients at risk for COVID-19 are in a state of rapid change based on information released by regulatory bodies including the CDC and federal and state organizations. These policies and algorithms were followed during the patient's care in the ED.  Some ED evaluations and interventions may be delayed as a result of limited staffing during and the pandemic.*   Note:  This document was prepared using Dragon voice recognition software and may include unintentional dictation errors.    Tommi Rumps, PA-C 03/07/21 1611    Sharyn Creamer, MD 03/07/21 Zollie Pee

## 2021-03-07 NOTE — ED Notes (Signed)
Patient declined discharge vital signs. 

## 2021-04-13 ENCOUNTER — Encounter: Payer: Self-pay | Admitting: Emergency Medicine

## 2021-04-13 ENCOUNTER — Other Ambulatory Visit: Payer: Self-pay

## 2021-04-13 ENCOUNTER — Emergency Department
Admission: EM | Admit: 2021-04-13 | Discharge: 2021-04-13 | Disposition: A | Discharge status: Home / Self Care | Payer: No Typology Code available for payment source | Attending: Emergency Medicine | Admitting: Emergency Medicine

## 2021-04-13 DIAGNOSIS — F17210 Nicotine dependence, cigarettes, uncomplicated: Secondary | ICD-10-CM | POA: Diagnosis not present

## 2021-04-13 DIAGNOSIS — J069 Acute upper respiratory infection, unspecified: Secondary | ICD-10-CM

## 2021-04-13 DIAGNOSIS — R0982 Postnasal drip: Secondary | ICD-10-CM

## 2021-04-13 DIAGNOSIS — R0981 Nasal congestion: Secondary | ICD-10-CM | POA: Diagnosis present

## 2021-04-13 LAB — POC URINE PREG, ED: Preg Test, Ur: NEGATIVE

## 2021-04-13 MED ORDER — KETOROLAC TROMETHAMINE 60 MG/2ML IM SOLN
30.0000 mg | Freq: Once | INTRAMUSCULAR | Status: AC
  Administered 2021-04-13: 30 mg via INTRAMUSCULAR
  Filled 2021-04-13: qty 2

## 2021-04-13 MED ORDER — CETIRIZINE HCL 10 MG PO TABS: 10.0000 mg | ORAL_TABLET | Freq: Every day | ORAL | 2 refills | Status: DC

## 2021-04-13 MED ORDER — FLUTICASONE PROPIONATE 50 MCG/ACT NA SUSP: 1.0000 | Freq: Every day | NASAL | 0 refills | Status: DC

## 2021-04-13 MED ORDER — ACETAMINOPHEN 325 MG PO TABS
650.0000 mg | ORAL_TABLET | Freq: Once | ORAL | Status: AC
  Administered 2021-04-13: 650 mg via ORAL
  Filled 2021-04-13: qty 2

## 2021-04-13 NOTE — ED Notes (Signed)
See triage note  Presents with sore throat and sinus pressure  States sxs' started couple of days ago  No fever

## 2021-04-13 NOTE — Discharge Instructions (Addendum)
Please use Tylenol and Ibuprofen for pain. You may also use Cepacol lozenges for sore throat. You may also use the Zyrtec and Flonase as prescribed.

## 2021-04-13 NOTE — ED Provider Notes (Signed)
[facility] Emergency Department Provider Note  ____________________________________________   Event Date/Time   First MD Initiated Contact with Patient 04/13/21 1459     (approximate)  I have reviewed the triage vital signs and the nursing notes.   HISTORY  Chief Complaint Facial Pain   HPI Michelle Washington is a 25 y.o. female who reports to the ER for evaluation of sore throat and nasal congestion with sinus pressure x 2 days. Patient reports she has a hx of getting an acute sinusitis and she would like medication prior to it getting that bad. She denies any fevers, reports drainage is clear. She reports feeling that her sore throat is related to it draining down the back of her throat. She denies any sick exposures, difficulty swallowing, difficulty breathing, denies any voice changes or other symptoms.  She reports that she has chronic allergies and is out of her Zyrtec and used Flonase once today but did not notice any improvement in symptoms.        Past Medical History:  Diagnosis Date   Anemia    Back pain    Costochondritis     Patient Active Problem List   Diagnosis Date Noted   Overweight 05/24/2015    Past Surgical History:  Procedure Laterality Date   TONSILLECTOMY      Prior to Admission medications   Medication Sig Start Date End Date Taking? Authorizing Provider  cetirizine (ZYRTEC ALLERGY) 10 MG tablet Take 1 tablet (10 mg total) by mouth daily. 04/13/21 04/13/22 Yes Agam Davenport, Ruben Gottron, PA  fluticasone (FLONASE) 50 MCG/ACT nasal spray Place 1 spray into both nostrils daily. 04/13/21 05/13/21 Yes Debie Ashline, Ruben Gottron, PA  cephALEXin (KEFLEX) 500 MG capsule Take 1 capsule (500 mg total) by mouth 3 (three) times daily. 03/07/21   Tommi Rumps, PA-C  ferrous sulfate 325 (65 FE) MG tablet Take 325 mg by mouth daily with breakfast.    [provider]  magic mouthwash SOLN Take 10 mLs by mouth 4 (four) times daily. 03/07/21   Tommi Rumps, PA-C    Allergies Watermelon [citrullus vulgaris]  Family History  Problem Relation Age of Onset   Diabetes Mother    Hypertension Mother    Sarcoidosis Mother    Alcohol abuse Maternal Grandmother    Diabetes Maternal Grandmother    Seizures Maternal Grandmother     Social History Social History   Tobacco Use   Smoking status: Some Days    Packs/day: 1.00    Pack years: 0.00    Types: Cigars, Cigarettes   Smokeless tobacco: Never  Vaping Use   Vaping Use: Never used  Substance Use Topics   Alcohol use: No   Drug use: No    Review of Systems Constitutional: No fever/chills Eyes: No visual changes. ENT: + sore throat + nasal congestion Cardiovascular: Denies chest pain. Respiratory: Denies shortness of breath. Gastrointestinal: No abdominal pain.  No nausea, no vomiting.  No diarrhea.  No constipation. Genitourinary: Negative for dysuria. Musculoskeletal: Negative for back pain. Skin: Negative for rash. Neurological: Negative for headaches, focal weakness or numbness.  ____________________________________________   PHYSICAL EXAM:  VITAL SIGNS: ED Triage Vitals [04/13/21 1428]  Enc Vitals Group     BP 113/70     Pulse Rate (!) 116     Resp 16     Temp 98.6 F (37 C)     Temp Source Oral     SpO2 99 %     Weight  190 lb (86.2 kg)     Height 5\' 6"  (1.676 m)     Head Circumference      Peak Flow      Pain Score 10     Pain Loc      Pain Edu?      Excl. in GC?    Constitutional: Alert and oriented. Well appearing and in no acute distress. Eyes: Conjunctivae are normal. PERRL. EOMI. Head: Atraumatic. Nose: No congestion/rhinnorhea. Mouth/Throat: Mucous membranes are moist.  Oropharynx non-erythematous. No tonsillar enlargement or exudate.  Lymphatic: No cervical lymphadenopathy.  Neck: No stridor.   Cardiovascular: Normal rate, regular rhythm. Grossly normal heart sounds.  Good peripheral circulation. Respiratory: Normal respiratory  effort.  No retractions. Lungs CTAB. Gastrointestinal: Soft and nontender. No distention. No abdominal bruits. No CVA tenderness. Musculoskeletal: No lower extremity tenderness nor edema.  No joint effusions. Neurologic:  Normal speech and language. No gross focal neurologic deficits are appreciated. No gait instability. Skin:  Skin is warm, dry and intact. No rash noted. Psychiatric: Mood and affect are normal. Speech and behavior are normal.  ____________________________________________   LABS (all labs ordered are listed, but only abnormal results are displayed)  Labs Reviewed  POC URINE PREG, ED    ____________________________________________   INITIAL IMPRESSION / ASSESSMENT AND PLAN / ED COURSE  As part of my medical decision making, I reviewed the following data within the electronic MEDICAL RECORD NUMBER Nursing notes reviewed and incorporated and Notes from prior ED visits        Patient is a 25 year old female who presents to the emergency department for evaluation of sore throat and nasal congestion.  She reports symptoms have been present for 2 days, denies any fever, change in voice, difficulty swallowing or difficulty breathing.  See HPI for further details.  In triage, vitals are within normal limits.  Physical exam is grossly reassuring and benign.  This is likely an exacerbation of the patient's known chronic allergies versus possible viral URI.  Recommended supportive care with Zyrtec, Flonase, Tylenol and anti-inflammatories.  She states that her throat pain is severe, and I offered her a shot of Toradol which she accepted.  Return precautions were discussed, patient stable this time for outpatient therapy.        ____________________________________________   FINAL CLINICAL IMPRESSION(S) / ED DIAGNOSES  Final diagnoses:  Viral URI  Post-nasal drip     ED Discharge Orders          Ordered    fluticasone (FLONASE) 50 MCG/ACT nasal spray  Daily         04/13/21 1536    cetirizine (ZYRTEC ALLERGY) 10 MG tablet  Daily        04/13/21 1536             Note:  This document was prepared using Dragon voice recognition software and may include unintentional dictation errors.    06/13/21, PA 04/13/21 1633    06/13/21, MD 04/13/21 [phone]

## 2021-04-13 NOTE — ED Triage Notes (Signed)
Pt comes into the ED via POV c/o sinus pressure and pain.  Pt states she has had nasal congestion and post nasal drip causing a sore throat.  Pt in NAD at this time.

## 2021-04-22 ENCOUNTER — Encounter: Payer: Self-pay | Admitting: Emergency Medicine

## 2021-04-22 ENCOUNTER — Other Ambulatory Visit: Payer: Self-pay

## 2021-04-22 ENCOUNTER — Emergency Department
Admission: EM | Admit: 2021-04-22 | Discharge: 2021-04-22 | Disposition: A | Discharge status: Home / Self Care | Payer: PRIVATE HEALTH INSURANCE | Attending: Emergency Medicine | Admitting: Emergency Medicine

## 2021-04-22 ENCOUNTER — Telehealth: Payer: Self-pay | Admitting: Student

## 2021-04-22 DIAGNOSIS — R300 Dysuria: Secondary | ICD-10-CM | POA: Diagnosis not present

## 2021-04-22 DIAGNOSIS — A599 Trichomoniasis, unspecified: Secondary | ICD-10-CM | POA: Insufficient documentation

## 2021-04-22 DIAGNOSIS — F17210 Nicotine dependence, cigarettes, uncomplicated: Secondary | ICD-10-CM | POA: Insufficient documentation

## 2021-04-22 DIAGNOSIS — G8929 Other chronic pain: Secondary | ICD-10-CM

## 2021-04-22 DIAGNOSIS — K0889 Other specified disorders of teeth and supporting structures: Secondary | ICD-10-CM | POA: Diagnosis not present

## 2021-04-22 LAB — URINALYSIS, COMPLETE (UACMP) WITH MICROSCOPIC
Bacteria, UA: NONE SEEN
Bilirubin Urine: NEGATIVE
Glucose, UA: NEGATIVE mg/dL
Hgb urine dipstick: NEGATIVE
Ketones, ur: NEGATIVE mg/dL
Nitrite: NEGATIVE
Protein, ur: NEGATIVE mg/dL
Specific Gravity, Urine: 1.019 (ref 1.005–1.030)
pH: 5 (ref 5.0–8.0)

## 2021-04-22 LAB — WET PREP, GENITAL
Clue Cells Wet Prep HPF POC: NONE SEEN
Sperm: NONE SEEN
Yeast Wet Prep HPF POC: NONE SEEN

## 2021-04-22 LAB — CHLAMYDIA/NGC RT PCR (ARMC ONLY)
Chlamydia Tr: NOT DETECTED
N gonorrhoeae: NOT DETECTED

## 2021-04-22 MED ORDER — METRONIDAZOLE 500 MG PO TABS: 500.0000 mg | ORAL_TABLET | Freq: Two times a day (BID) | ORAL | 0 refills | Status: AC

## 2021-04-22 NOTE — ED Triage Notes (Signed)
Pt reports that she noticed that she has a hole in her lower left bottom tooth. She has food and drink with her during triage. Pt is also complaining that her nail scratched herself when wiping and it burns her when she pees. She wants to make sure it is not infected.

## 2021-04-22 NOTE — Telephone Encounter (Cosign Needed)
Patient informed of wet prep results and need for Flagyl. Patient in agreement and advised to avoid sexual contact until she has finished treatment. Also advised that any partner should be tested and/or treated as well. Return precautions for ER were discussed.

## 2021-04-22 NOTE — ED Notes (Signed)
After giving specimens, patient left without discharge instructions. PA-C is aware.

## 2021-04-22 NOTE — ED Notes (Signed)
Patient states she needs to leave to pick up her child. PA-C is aware. Patient verified her phone is [phone] and a message can be left.

## 2021-04-22 NOTE — ED Provider Notes (Signed)
[facility] Emergency Department Provider Note  ____________________________________________   Event Date/Time   First MD Initiated Contact with Patient 04/22/21 1150     (approximate)  I have reviewed the triage vital signs and the nursing notes.   HISTORY  Chief Complaint Dental Pain, ?UTI  HPI Panama Huertas is a 25 y.o. female who presents to the ER for evaluation of multiple medical complaints.  First, patient complains of left lower dental pain.  She states that she was seen approximately a month ago for the same and was given Magic mouthwash.  She did follow-up with a dentist who recommended either extraction versus root canal and she has been delaying the procedure due to needing to come up with the money to have the root canal.  She does have some Magic mouthwash left, but has not been using it.  She describes the pain as intermittent, denies any pain down into the left lower mandible, denies pain under the tongue, denies fever or any other changes.  In addition, patient states that several days ago while wiping post urination, she accidentally scratched herself.  Since that time she has had dysuria.  She states that her partner was recently treated for a "UTI" and states that he was not treated for any STDs, however states that she would like to be checked.  She denies any vaginal bleeding or discharge, denies any associated abdominal pain or fevers.        Past Medical History:  Diagnosis Date   Anemia    Back pain    Costochondritis     Patient Active Problem List   Diagnosis Date Noted   Overweight 05/24/2015    Past Surgical History:  Procedure Laterality Date   TONSILLECTOMY      Prior to Admission medications   Medication Sig Start Date End Date Taking? Authorizing Provider  cephALEXin (KEFLEX) 500 MG capsule Take 1 capsule (500 mg total) by mouth 3 (three) times daily. 03/07/21   Tommi Rumps, PA-C  cetirizine (ZYRTEC ALLERGY)  10 MG tablet Take 1 tablet (10 mg total) by mouth daily. 04/13/21 04/13/22  Lucy Chris, PA  ferrous sulfate 325 (65 FE) MG tablet Take 325 mg by mouth daily with breakfast.    [provider]  fluticasone (FLONASE) 50 MCG/ACT nasal spray Place 1 spray into both nostrils daily. 04/13/21 05/13/21  Lucy Chris, PA  magic mouthwash SOLN Take 10 mLs by mouth 4 (four) times daily. 03/07/21   Tommi Rumps, PA-C  metroNIDAZOLE (FLAGYL) 500 MG tablet Take 1 tablet (500 mg total) by mouth 2 (two) times daily for 7 days. 04/22/21 04/29/21  Lucy Chris, PA    Allergies Watermelon [citrullus vulgaris]  Family History  Problem Relation Age of Onset   Diabetes Mother    Hypertension Mother    Sarcoidosis Mother    Alcohol abuse Maternal Grandmother    Diabetes Maternal Grandmother    Seizures Maternal Grandmother     Social History Social History   Tobacco Use   Smoking status: Some Days    Packs/day: 1.00    Pack years: 0.00    Types: Cigars, Cigarettes   Smokeless tobacco: Never  Vaping Use   Vaping Use: Never used  Substance Use Topics   Alcohol use: No   Drug use: No    Review of Systems Constitutional: No fever/chills Eyes: No visual changes. ENT: + Left lower dental pain, no sore throat. Cardiovascular: Denies chest pain. Respiratory:  Denies shortness of breath. Gastrointestinal: No abdominal pain.  No nausea, no vomiting.  No diarrhea.  No constipation. Genitourinary: + dysuria. Musculoskeletal: Negative for back pain. Skin: Negative for rash. Neurological: Negative for headaches, focal weakness or numbness.  ____________________________________________   PHYSICAL EXAM:  VITAL SIGNS: ED Triage Vitals  Enc Vitals Group     BP 04/22/21 1140 119/81     Pulse Rate 04/22/21 1140 (!) 103     Resp 04/22/21 1140 16     Temp 04/22/21 1140 98.8 F (37.1 C)     Temp Source 04/22/21 1140 Oral     SpO2 04/22/21 1140 99 %     Weight 04/22/21 1141 200  lb (90.7 kg)     Height 04/22/21 1141 5\' 6"  (1.676 m)     Head Circumference --      Peak Flow --      Pain Score 04/22/21 1140 10     Pain Loc --      Pain Edu? --      Excl. in GC? --    Constitutional: Alert and oriented. Well appearing and in no acute distress. Eyes: Conjunctivae are normal. PERRL. EOMI. Head: Atraumatic. Nose: No congestion/rhinnorhea. Mouth/Throat: Mucous membranes are moist.  Oropharynx non-erythematous.  There is a divot in the left posterior most molar, nontender to palpation, no evidence of periapical abscess.  No tenderness to palpation under the tongue, no submandibular or cervical lymphadenopathy present. Neck: No stridor.   Cardiovascular: Normal rate, regular rhythm. Grossly normal heart sounds.  Good peripheral circulation. Respiratory: Normal respiratory effort.  No retractions. Lungs CTAB. Gastrointestinal: Soft and nontender. No distention. No abdominal bruits. No CVA tenderness. Musculoskeletal: No lower extremity tenderness nor edema.  No joint effusions. Neurologic:  Normal speech and language. No gross focal neurologic deficits are appreciated. No gait instability. Skin:  Skin is warm, dry and intact. No rash noted. Psychiatric: Mood and affect are normal. Speech and behavior are normal.  ____________________________________________   LABS (all labs ordered are listed, but only abnormal results are displayed)  Labs Reviewed  WET PREP, GENITAL - Abnormal; Notable for the following components:      Result Value   Trich, Wet Prep PRESENT (*)    WBC, Wet Prep HPF POC MANY (*)    All other components within normal limits  URINALYSIS, COMPLETE (UACMP) WITH MICROSCOPIC - Abnormal; Notable for the following components:   Color, Urine YELLOW (*)    APPearance HAZY (*)    Leukocytes,Ua MODERATE (*)    Trichomonas, UA PRESENT (*)    All other components within normal limits  CHLAMYDIA/NGC RT PCR ([facility] ONLY)              ____________________________________________   INITIAL IMPRESSION / ASSESSMENT AND PLAN / ED COURSE  As part of my medical decision making, I reviewed the following data within the electronic MEDICAL RECORD NUMBER Nursing notes reviewed and incorporated, Labs reviewed, and Notes from prior ED visits        Patient is a 25 year old female who presents to the emergency department for evaluation of dental pain as well as dysuria.  See HPI for further details.  In triage, patient is mildly tachycardic but otherwise has normal vital signs.  Physical exam as above.  In regards to the patient's dental pain, she already has close follow-up established with dentistry and has no evidence of current infection at this time.  She also endorses that she was treated with amoxicillin by her dentist and  has not had any change or worsening in the pain since that time.  Low suspicion for infection or need for antibiotics for this, recommend that she continue to follow-up with dentistry.  Advised to return for any fever, worsening pain, swelling or other changes.  In regards to the patient's dysuria, did order a urinalysis as well as wet prep and GC/chlamydia.  The patient eloped prior to any of the results returning.  I did call the patient and contact her and spoke with her on the phone and advised her of her positive trichomonas results and discussed the need for treatment with Flagyl.  See separate telephone encounter, however patient was amenable with plan and these were sent to her preferred pharmacy.  Return precautions were discussed over the phone.      ____________________________________________   FINAL CLINICAL IMPRESSION(S) / ED DIAGNOSES  Final diagnoses:  Chronic dental pain  Dysuria  Trichimoniasis     ED Discharge Orders     None        Note:  This document was prepared using Dragon voice recognition software and may include unintentional dictation errors.    Lucy Chris, PA 04/22/21 1729    Jene Every, MD 04/24/21 1440

## 2021-07-29 ENCOUNTER — Encounter: Payer: Self-pay | Admitting: *Deleted

## 2021-07-29 ENCOUNTER — Emergency Department
Admission: EM | Admit: 2021-07-29 | Discharge: 2021-07-30 | Disposition: A | Discharge status: Home / Self Care | Payer: No Typology Code available for payment source | Attending: Emergency Medicine | Admitting: Emergency Medicine

## 2021-07-29 ENCOUNTER — Other Ambulatory Visit: Payer: Self-pay

## 2021-07-29 DIAGNOSIS — F17210 Nicotine dependence, cigarettes, uncomplicated: Secondary | ICD-10-CM | POA: Insufficient documentation

## 2021-07-29 DIAGNOSIS — Y92009 Unspecified place in unspecified non-institutional (private) residence as the place of occurrence of the external cause: Secondary | ICD-10-CM | POA: Diagnosis not present

## 2021-07-29 DIAGNOSIS — S91115A Laceration without foreign body of left lesser toe(s) without damage to nail, initial encounter: Secondary | ICD-10-CM | POA: Insufficient documentation

## 2021-07-29 DIAGNOSIS — Y9301 Activity, walking, marching and hiking: Secondary | ICD-10-CM | POA: Diagnosis not present

## 2021-07-29 DIAGNOSIS — W228XXA Striking against or struck by other objects, initial encounter: Secondary | ICD-10-CM | POA: Insufficient documentation

## 2021-07-29 DIAGNOSIS — S90935A Unspecified superficial injury of left lesser toe(s), initial encounter: Secondary | ICD-10-CM | POA: Diagnosis present

## 2021-07-29 NOTE — ED Provider Notes (Signed)
[facility] Emergency Department Provider Note  ____________________________________________  Time seen: Approximately 11:11 PM  I have reviewed the triage vital signs and the nursing notes.   HISTORY  Chief Complaint Extremity Laceration    HPI Michelle Washington is a 25 y.o. female who presents the emergency department complaining of laceration to the pinky toe.  Patient states that she was walking through her aunts house when she struck a piece of furniture in the hallway with her fifth toe of the left foot.  She sustained a laceration along the plantar surface of the MTP joint.  No other injury or complaint.  Up-to-date on immunizations.  Patient states that she still ambulatory and only complaint is laceration.       Past Medical History:  Diagnosis Date   Anemia    Back pain    Costochondritis     Patient Active Problem List   Diagnosis Date Noted   Overweight 05/24/2015    Past Surgical History:  Procedure Laterality Date   TONSILLECTOMY      Prior to Admission medications   Medication Sig Start Date End Date Taking? Authorizing Provider  cephALEXin (KEFLEX) 500 MG capsule Take 1 capsule (500 mg total) by mouth 3 (three) times daily. 03/07/21   Tommi Rumps, PA-C  cetirizine (ZYRTEC ALLERGY) 10 MG tablet Take 1 tablet (10 mg total) by mouth daily. 04/13/21 04/13/22  Lucy Chris, PA  ferrous sulfate 325 (65 FE) MG tablet Take 325 mg by mouth daily with breakfast.    [provider]  fluticasone (FLONASE) 50 MCG/ACT nasal spray Place 1 spray into both nostrils daily. 04/13/21 05/13/21  Lucy Chris, PA  magic mouthwash SOLN Take 10 mLs by mouth 4 (four) times daily. 03/07/21   Tommi Rumps, PA-C    Allergies Watermelon [citrullus vulgaris]  Family History  Problem Relation Age of Onset   Diabetes Mother    Hypertension Mother    Sarcoidosis Mother    Alcohol abuse Maternal Grandmother    Diabetes Maternal Grandmother     Seizures Maternal Grandmother     Social History Social History   Tobacco Use   Smoking status: Some Days    Packs/day: 1.00    Types: Cigars, Cigarettes   Smokeless tobacco: Never  Vaping Use   Vaping Use: Never used  Substance Use Topics   Alcohol use: No   Drug use: No     Review of Systems  Constitutional: No fever/chills Eyes: No visual changes. No discharge ENT: No upper respiratory complaints. Cardiovascular: no chest pain. Respiratory: no cough. No SOB. Gastrointestinal: No abdominal pain.  No nausea, no vomiting.  No diarrhea.  No constipation. Musculoskeletal: Laceration to the plantar aspect of the fifth toe left foot Skin: Negative for rash, abrasions, lacerations, ecchymosis. Neurological: Negative for headaches, focal weakness or numbness.  10 System ROS otherwise negative.  ____________________________________________   PHYSICAL EXAM:  VITAL SIGNS: ED Triage Vitals  Enc Vitals Group     BP 07/29/21 2211 111/75     Pulse Rate 07/29/21 2211 (!) 101     Resp 07/29/21 2211 14     Temp 07/29/21 2214 98.1 F (36.7 C)     Temp Source 07/29/21 2214 Oral     SpO2 07/29/21 2211 99 %     Weight --      Height --      Head Circumference --      Peak Flow --      Pain  Score 07/29/21 2214 6     Pain Loc --      Pain Edu? --      Excl. in GC? --      Constitutional: Alert and oriented. Well appearing and in no acute distress. Eyes: Conjunctivae are normal. PERRL. EOMI. Head: Atraumatic. ENT:      Ears:       Nose: No congestion/rhinnorhea.      Mouth/Throat: Mucous membranes are moist.  Neck: No stridor.    Cardiovascular: Normal rate, regular rhythm. Normal S1 and S2.  Good peripheral circulation. Respiratory: Normal respiratory effort without tachypnea or retractions. Lungs CTAB. Good air entry to the bases with no decreased or absent breath sounds. Musculoskeletal: Full range of motion to all extremities. No gross deformities appreciated.   Visualization of the left foot reveals a 0.5 cm laceration along the plantar aspect just distal to the MTP joint of the toe.  No active bleeding.  No visible foreign body.  Edges are smooth in nature.  This appears to go into the dermal tissue but does not appear to affect the subcutaneous tissue.  No tenderness over the digit itself.  Sensation capillary refill intact. Neurologic:  Normal speech and language. No gross focal neurologic deficits are appreciated.  Skin:  Skin is warm, dry and intact. No rash noted. Psychiatric: Mood and affect are normal. Speech and behavior are normal. Patient exhibits appropriate insight and judgement.   ____________________________________________   LABS (all labs ordered are listed, but only abnormal results are displayed)  Labs Reviewed - No data to display ____________________________________________  EKG   ____________________________________________  RADIOLOGY   No results found.  ____________________________________________    PROCEDURES  Procedure(s) performed:    Marland KitchenMarland KitchenLaceration Repair  Date/Time: 07/29/2021 11:26 PM Performed by: Racheal Patches, PA-C Authorized by: Racheal Patches, PA-C   Consent:    Consent obtained:  Verbal   Consent given by:  Patient   Risks discussed:  Infection and pain Universal protocol:    Procedure explained and questions answered to patient or proxy's satisfaction: yes     Immediately prior to procedure, a time out was called: yes     Patient identity confirmed:  Verbally with patient Anesthesia:    Anesthesia method:  None Laceration details:    Location:  Toe   Toe location:  L little toe   Length (cm):  0.5 Pre-procedure details:    Preparation:  Patient was prepped and draped in usual sterile fashion Exploration:    Limited defect created (wound extended): no     Imaging outcome: foreign body not noted     Wound exploration: wound explored through full range of motion and  entire depth of wound visualized     Wound extent: no foreign bodies/material noted, no tendon damage noted and no underlying fracture noted     Contaminated: no   Treatment:    Area cleansed with:  Shur-Clens   Amount of cleaning:  Standard Skin repair:    Repair method:  Tissue adhesive Approximation:    Approximation:  Close Post-procedure details:    Dressing:  Splint for protection   Procedure completion:  Tolerated well, no immediate complications    Medications - No data to display   ____________________________________________   INITIAL IMPRESSION / ASSESSMENT AND PLAN / ED COURSE  Pertinent labs & imaging results that were available during my care of the patient were reviewed by me and considered in my medical decision making (see chart for details).  Review of the [place] CSRS was performed in accordance of the [facility] prior to dispensing any controlled drugs.           Patient's diagnosis is consistent with toe laceration.  Patient presented to the emergency department complaining of a laceration to the plantar aspect of the left foot under the fifth toe.  There is a 0.5 cm laceration that appears to involve the dermal tissue but not extend into the subcutaneous tissue.  No bleeding.  Patient was nontender over the toe itself and I have low suspicion for any underlying fracture.  Patient will have Dermabond for closure after discussion of Dermabond versus a single stitch.  Patient will have postop shoe to limit the range of motion to the toe.  Follow-up primary care as needed..  Patient is given ED precautions to return to the ED for any worsening or new symptoms.     ____________________________________________  FINAL CLINICAL IMPRESSION(S) / ED DIAGNOSES  Final diagnoses:  None      NEW MEDICATIONS STARTED DURING THIS VISIT:  ED Discharge Orders     None           This chart was dictated using voice recognition software/Dragon. Despite best efforts to  proofread, errors can occur which can change the meaning. Any change was purely unintentional.    Racheal Patches, PA-C 07/29/21 2327    Sharman Cheek, MD 07/30/21 [phone]

## 2021-07-29 NOTE — ED Triage Notes (Signed)
Pt reports she hit her foot on something in the hallway, laceration at the base of the 5th toe (plantar surface). Bleeding controlled.

## 2021-08-02 ENCOUNTER — Other Ambulatory Visit: Payer: Self-pay

## 2021-08-02 ENCOUNTER — Emergency Department
Admission: EM | Admit: 2021-08-02 | Discharge: 2021-08-02 | Disposition: A | Discharge status: Home / Self Care | Payer: PRIVATE HEALTH INSURANCE | Attending: Emergency Medicine | Admitting: Emergency Medicine

## 2021-08-02 DIAGNOSIS — X58XXXD Exposure to other specified factors, subsequent encounter: Secondary | ICD-10-CM | POA: Insufficient documentation

## 2021-08-02 DIAGNOSIS — F17210 Nicotine dependence, cigarettes, uncomplicated: Secondary | ICD-10-CM | POA: Diagnosis not present

## 2021-08-02 DIAGNOSIS — S91115D Laceration without foreign body of left lesser toe(s) without damage to nail, subsequent encounter: Secondary | ICD-10-CM | POA: Insufficient documentation

## 2021-08-02 MED ORDER — LIDOCAINE 4 % EX CREA
TOPICAL_CREAM | Freq: Once | CUTANEOUS | Status: AC
  Filled 2021-08-02: qty 5

## 2021-08-02 NOTE — ED Notes (Signed)
See triage note  presents with pain to pain to left foot  states she was seen and had glue placed to 5 th toe   thinks it popped open

## 2021-08-02 NOTE — ED Triage Notes (Signed)
Pt comes with c/o left pinkie toe pain. Pt states she was seen here for it and had glue placed. Pt states she thinks it opened back up and is very painful.

## 2021-08-02 NOTE — ED Provider Notes (Signed)
[facility] Emergency Department Provider Note   ____________________________________________   Event Date/Time   First MD Initiated Contact with Patient 08/02/21 1551     (approximate)  I have reviewed the triage vital signs and the nursing notes.   HISTORY  Chief Complaint foot pain    HPI Panama Weekes is a 25 y.o. female who presents for left fifth toe pain after a laceration that was sustained approximately 4 days prior to arrival and closed with surgical glue.  Since that time, patient has felt like the wound has opened back up and has been causing significant pain.  Patient describes the pain as sharp, nonradiating, 6/10, and worsened with any ambulation.  Patient states his pain is partially relieved at rest or with raising the foot to an elevated position.  Patient denies any purulent drainage, surrounding redness, or pain tracking up the foot.  Patient also denies any fevers, nausea, vomiting, or chest pain          Past Medical History:  Diagnosis Date   Anemia    Back pain    Costochondritis     Patient Active Problem List   Diagnosis Date Noted   Overweight 05/24/2015    Past Surgical History:  Procedure Laterality Date   TONSILLECTOMY      Prior to Admission medications   Medication Sig Start Date End Date Taking? Authorizing Provider  cetirizine (ZYRTEC ALLERGY) 10 MG tablet Take 1 tablet (10 mg total) by mouth daily. 04/13/21 04/13/22  Lucy Chris, PA  ferrous sulfate 325 (65 FE) MG tablet Take 325 mg by mouth daily with breakfast.    [provider]  fluticasone (FLONASE) 50 MCG/ACT nasal spray Place 1 spray into both nostrils daily. 04/13/21 05/13/21  Lucy Chris, PA    Allergies Watermelon [citrullus vulgaris]  Family History  Problem Relation Age of Onset   Diabetes Mother    Hypertension Mother    Sarcoidosis Mother    Alcohol abuse Maternal Grandmother    Diabetes Maternal Grandmother     Seizures Maternal Grandmother     Social History Social History   Tobacco Use   Smoking status: Some Days    Packs/day: 1.00    Types: Cigars, Cigarettes   Smokeless tobacco: Never  Vaping Use   Vaping Use: Never used  Substance Use Topics   Alcohol use: No   Drug use: No    Review of Systems Constitutional: No fever/chills Eyes: No visual changes. ENT: No sore throat. Cardiovascular: Denies chest pain. Respiratory: Denies shortness of breath. Gastrointestinal: No abdominal pain.  No nausea, no vomiting.  No diarrhea. Genitourinary: Negative for dysuria. Musculoskeletal: Positive for left fifth toe pain Skin: Negative for rash. Neurological: Negative for headaches, weakness/numbness/paresthesias in any extremity Psychiatric: Negative for suicidal ideation/homicidal ideation   ____________________________________________   PHYSICAL EXAM:  VITAL SIGNS: ED Triage Vitals  Enc Vitals Group     BP 08/02/21 1434 111/64     Pulse Rate 08/02/21 1434 (!) 110     Resp 08/02/21 1434 18     Temp 08/02/21 1434 98.2 F (36.8 C)     Temp src --      SpO2 08/02/21 1434 100 %     Weight 08/02/21 1558 199 lb 15.3 oz (90.7 kg)     Height 08/02/21 1558 5\' 6"  (1.676 m)     Head Circumference --      Peak Flow --      Pain Score 08/02/21 1433  4     Pain Loc --      Pain Edu? --      Excl. in GC? --    Constitutional: Alert and oriented. Well appearing and in no acute distress. Eyes: Conjunctivae are normal. PERRL. Head: Atraumatic. Nose: No congestion/rhinnorhea. Mouth/Throat: Mucous membranes are moist. Neck: No stridor Cardiovascular: Grossly normal heart sounds.  Good peripheral circulation. Respiratory: Normal respiratory effort.  No retractions. Gastrointestinal: Soft and nontender. No distention. Musculoskeletal: No obvious deformities Neurologic:  Normal speech and language. No gross focal neurologic deficits are appreciated. Skin:  Skin is warm and dry. No rash  noted.  Small subcentimeter laceration to the sole of the left fifth digit at the MTP joint without any surrounding erythema or purulent drainage from the wound and with a small amount of surgical glue overlying the laceration Psychiatric: Mood and affect are normal. Speech and behavior are normal.  ____________________________________________   LABS (all labs ordered are listed, but only abnormal results are displayed)  Labs Reviewed - No data to display  PROCEDURES  Procedure(s) performed (including Critical Care):  Procedures   ____________________________________________   INITIAL IMPRESSION / ASSESSMENT AND PLAN / ED COURSE  As part of my medical decision making, I reviewed the following data within the electronic medical record, if available:  Nursing notes reviewed and incorporated, Labs reviewed, EKG interpreted, Old chart reviewed, Radiograph reviewed and Notes from prior ED visits reviewed and incorporated        Patient is a 25 year old female with the above-stated past medical history who presents for concerns of the left fifth digit laceration on the foot after a repair the previous Saturday.  Differential diagnosis includes but is not limited to: Wound infection, osteomyelitis, wound dehiscence  Given history and physical exam, patient's laceration does not require any more repair at this time.  Patient is requesting increased pain control and will provide lidocaine topically for continued pain as well as instructions to continue in the surgical shoe.  Dispo: Discharge home with PCP follow-up as needed      ____________________________________________   FINAL CLINICAL IMPRESSION(S) / ED DIAGNOSES  Final diagnoses:  Laceration of fifth toe of left foot, subsequent encounter     ED Discharge Orders     None        Note:  This document was prepared using Dragon voice recognition software and may include unintentional dictation errors.    Merwyn Katos, MD 08/02/21 [phone]

## 2021-08-21 ENCOUNTER — Emergency Department: Payer: No Typology Code available for payment source

## 2021-08-21 ENCOUNTER — Other Ambulatory Visit: Payer: Self-pay

## 2021-08-21 ENCOUNTER — Emergency Department
Admission: EM | Admit: 2021-08-21 | Discharge: 2021-08-21 | Disposition: A | Discharge status: Home / Self Care | Payer: No Typology Code available for payment source | Attending: Student in an Organized Health Care Education/Training Program | Admitting: Student in an Organized Health Care Education/Training Program

## 2021-08-21 DIAGNOSIS — Y999 Unspecified external cause status: Secondary | ICD-10-CM | POA: Diagnosis not present

## 2021-08-21 DIAGNOSIS — F17210 Nicotine dependence, cigarettes, uncomplicated: Secondary | ICD-10-CM | POA: Insufficient documentation

## 2021-08-21 DIAGNOSIS — S92535A Nondisplaced fracture of distal phalanx of left lesser toe(s), initial encounter for closed fracture: Secondary | ICD-10-CM | POA: Diagnosis not present

## 2021-08-21 DIAGNOSIS — Y939 Activity, unspecified: Secondary | ICD-10-CM | POA: Diagnosis not present

## 2021-08-21 DIAGNOSIS — S97122A Crushing injury of left lesser toe(s), initial encounter: Secondary | ICD-10-CM | POA: Diagnosis present

## 2021-08-21 DIAGNOSIS — W52XXXA Crushed, pushed or stepped on by crowd or human stampede, initial encounter: Secondary | ICD-10-CM | POA: Diagnosis not present

## 2021-08-21 DIAGNOSIS — Y929 Unspecified place or not applicable: Secondary | ICD-10-CM | POA: Diagnosis not present

## 2021-08-21 MED ORDER — HYDROCODONE-ACETAMINOPHEN 5-325 MG PO TABS: 1.0000 | ORAL_TABLET | ORAL | 0 refills | Status: DC | PRN

## 2021-08-21 MED ORDER — OXYCODONE-ACETAMINOPHEN 5-325 MG PO TABS
1.0000 | ORAL_TABLET | Freq: Once | ORAL | Status: AC
  Administered 2021-08-21: 1 via ORAL
  Filled 2021-08-21: qty 1

## 2021-08-21 MED ORDER — BACITRACIN ZINC 500 UNIT/GM EX OINT
TOPICAL_OINTMENT | Freq: Once | CUTANEOUS | Status: AC
  Filled 2021-08-21: qty 0.9

## 2021-08-21 NOTE — ED Notes (Signed)
See triage note  presents with injury to left foot  states she had laceration couple of weeks ago  it was glued at that time  states her friend hit her toe and opened area again

## 2021-08-21 NOTE — ED Notes (Signed)
Bacitracin to wound betweenn toes 5th and 4th right with non stick dressing and conform.  Placed sock over that.

## 2021-08-21 NOTE — ED Triage Notes (Signed)
Pt states that she was just seen a couple weeks ago for the same injury, pt states that it was glued to repair it, pt states that it had healed and her friend stepped on her foot last pm and it busted open again, pt's left 5th toe

## 2021-08-21 NOTE — ED Provider Notes (Signed)
[facility] Emergency Department Provider Note    Event Date/Time   First MD Initiated Contact with Patient 08/21/21 1151     (approximate)  I have reviewed the triage vital signs and the nursing notes.   HISTORY  Chief Complaint Laceration    HPI Michelle Washington is a 25 y.o. female with below listed past medical history presents to the ER for evaluation of left pinky toe pain.  States that her friend stepped on her left toe last night she did feel a popping sensation with moderate to severe pain.  She just recently was evaluated for laceration on the plantar aspect of that same toe did note some bleeding after the injury and was worried that she had reopen the laceration.  Pain today is a little bit more distal under the nail.  Feels very different from the previous injury.  Past Medical History:  Diagnosis Date   Anemia    Back pain    Costochondritis    Family History  Problem Relation Age of Onset   Diabetes Mother    Hypertension Mother    Sarcoidosis Mother    Alcohol abuse Maternal Grandmother    Diabetes Maternal Grandmother    Seizures Maternal Grandmother    Past Surgical History:  Procedure Laterality Date   TONSILLECTOMY     Patient Active Problem List   Diagnosis Date Noted   Overweight 05/24/2015      Prior to Admission medications   Medication Sig Start Date End Date Taking? Authorizing Provider  HYDROcodone-acetaminophen (NORCO) 5-325 MG tablet Take 1 tablet by mouth every 4 (four) hours as needed for moderate pain. 08/21/21  Yes Willy Eddy, MD  cetirizine (ZYRTEC ALLERGY) 10 MG tablet Take 1 tablet (10 mg total) by mouth daily. 04/13/21 04/13/22  Lucy Chris, PA  ferrous sulfate 325 (65 FE) MG tablet Take 325 mg by mouth daily with breakfast.    [provider]  fluticasone (FLONASE) 50 MCG/ACT nasal spray Place 1 spray into both nostrils daily. 04/13/21 05/13/21  Lucy Chris, PA     Allergies Watermelon [citrullus vulgaris]    Social History Social History   Tobacco Use   Smoking status: Some Days    Packs/day: 1.00    Types: Cigars, Cigarettes   Smokeless tobacco: Never  Vaping Use   Vaping Use: Never used  Substance Use Topics   Alcohol use: No   Drug use: No    Review of Systems Patient denies headaches, rhinorrhea, blurry vision, numbness, shortness of breath, chest pain, edema, cough, abdominal pain, nausea, vomiting, diarrhea, dysuria, fevers, rashes or hallucinations unless otherwise stated above in HPI. ____________________________________________   PHYSICAL EXAM:  VITAL SIGNS: Vitals:   08/21/21 1032  BP: 104/80  Pulse: 77  Resp: 16  Temp: 98.5 F (36.9 C)  SpO2: 98%    Constitutional: Alert and oriented. Well appearing and in no acute distress. Eyes: Conjunctivae are normal.  Head: Atraumatic. Nose: No congestion/rhinnorhea. Mouth/Throat: Mucous membranes are moist.   Neck: Painless ROM.  Cardiovascular:   Good peripheral circulation. Respiratory: Normal respiratory effort.  No retractions.  Gastrointestinal: Soft and nontender.  Musculoskeletal: No lower extremity tenderness .  No joint effusions.  Fusiform swelling of the lesser that toe.  There is dried blood but no evidence of laceration or puncture wound.  Previous laceration appears to be well-healing and intact.  Do not see any distal injury to suggest an open fracture previous laceration source of bleeding appears to  be more proximal. Neurologic:  Normal speech and language. No gross focal neurologic deficits are appreciated.  Skin:  Skin is warm, dry and intact. No rash noted. Psychiatric: Mood and affect are normal. Speech and behavior are normal.  ____________________________________________   LABS (all labs ordered are listed, but only abnormal results are displayed)  No results found for this or any previous visit (from the past 24  hour(s)). ____________________________________________ ____________________________________________  RADIOLOGY  I personally reviewed all radiographic images ordered to evaluate for the above acute complaints and reviewed radiology reports and findings.  These findings were personally discussed with the patient.  Please see medical record for radiology report.  ____________________________________________   PROCEDURES  Procedure(s) performed:  Procedures    Critical Care performed: no ____________________________________________   INITIAL IMPRESSION / ASSESSMENT AND PLAN / ED COURSE  Pertinent labs & imaging results that were available during my care of the patient were reviewed by me and considered in my medical decision making (see chart for details).   DDX: laceration, fracture, dislocation, contusion  Michelle Washington is a 25 y.o. who presents to the ED with injury to left small toe as described above.  Does have evidence of minimally displaced fracture through the distal phalanx.   Does not appear to have new laceration or findings to suggest an open fracture.  She may have had some superficial separation of previous laceration which is more proximal than the distal fracture is currently hemostatic.  I do not believe it requires additional Dermabond and.  Discussed buddy taping as well as patient already has a fracture shoe from previous visit discussed follow-up with podiatry.  Patient given pain medication and she is agreeable to plan.    The patient was evaluated in Emergency Department today for the symptoms described in the history of present illness. He/she was evaluated in the context of the global COVID-19 pandemic, which necessitated consideration that the patient might be at risk for infection with the SARS-CoV-2 virus that causes COVID-19. Institutional protocols and algorithms that pertain to the evaluation of patients at risk for COVID-19 are in a state of rapid change  based on information released by regulatory bodies including the CDC and federal and state organizations. These policies and algorithms were followed during the patient's care in the ED.   ____________________________________________   FINAL CLINICAL IMPRESSION(S) / ED DIAGNOSES  Final diagnoses:  Closed nondisplaced fracture of distal phalanx of lesser toe of left foot, initial encounter      NEW MEDICATIONS STARTED DURING THIS VISIT:  New Prescriptions   HYDROCODONE-ACETAMINOPHEN (NORCO) 5-325 MG TABLET    Take 1 tablet by mouth every 4 (four) hours as needed for moderate pain.     Note:  This document was prepared using Dragon voice recognition software and may include unintentional dictation errors.     Willy Eddy, MD 08/21/21 1255

## 2021-12-31 IMAGING — DX DG TOE 5TH 2+V*L*
3 series · 3 of 3 positions shown · non-contrast
Comparison: None.

CLINICAL DATA: Pain

EXAM:
DG TOE 5TH LEFT

[toe ap]
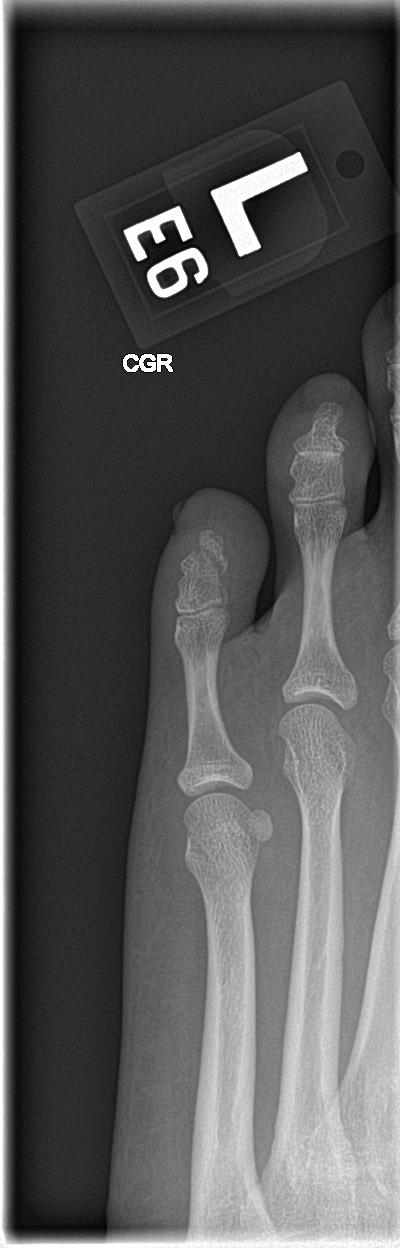

[toe obl]
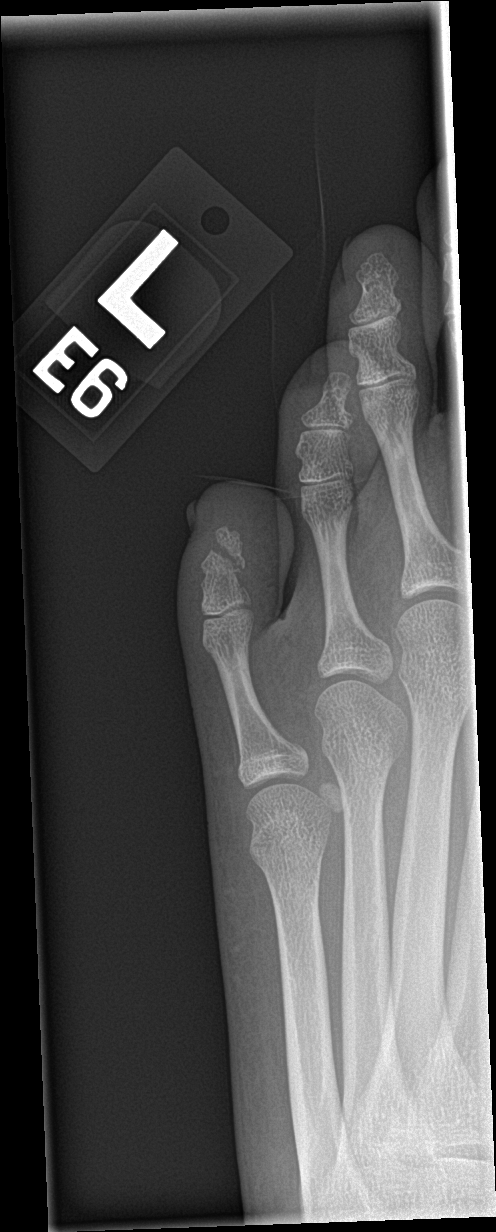

[toe lat]
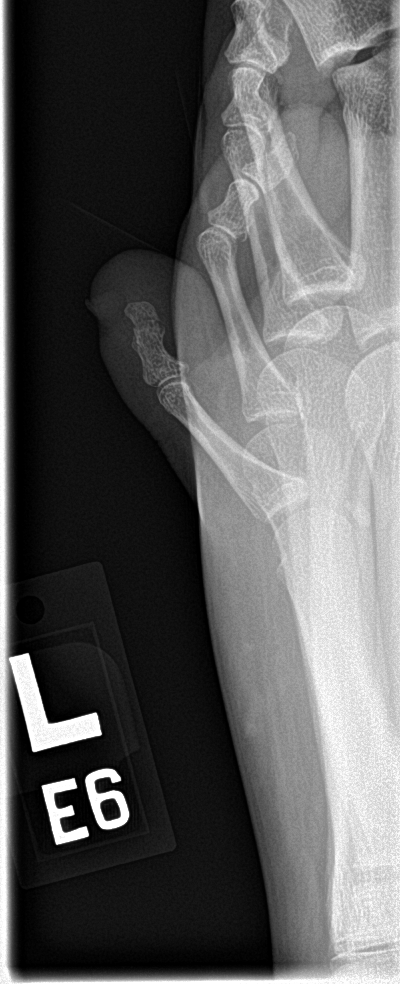

[3 of 3 positions shown; findings below may reference images not displayed]

FINDINGS: There is oblique lucency through the distal phalanx of the fifth toe
consistent with minimally displaced fracture. There is a possible
separate vertically oriented fracture through the base of the distal
phalanx with intra-articular extension no other fracture is seen.
Alignment is normal. There is no radiopaque foreign body or soft
tissue gas.
IMPRESSION: Minimally displaced obliquely oriented fracture through the fifth
toe distal phalanx, and suspected separate nondisplaced fracture
lucency at the base of the distal phalanx with intra-articular
extension.

## 2022-06-11 ENCOUNTER — Encounter: Payer: Self-pay | Admitting: Emergency Medicine

## 2022-06-11 ENCOUNTER — Other Ambulatory Visit: Payer: Self-pay

## 2022-06-11 ENCOUNTER — Emergency Department
Admission: EM | Admit: 2022-06-11 | Discharge: 2022-06-11 | Disposition: A | Discharge status: Home / Self Care | Payer: PRIVATE HEALTH INSURANCE | Attending: Emergency Medicine | Admitting: Emergency Medicine

## 2022-06-11 DIAGNOSIS — G8929 Other chronic pain: Secondary | ICD-10-CM | POA: Diagnosis not present

## 2022-06-11 DIAGNOSIS — K029 Dental caries, unspecified: Secondary | ICD-10-CM | POA: Insufficient documentation

## 2022-06-11 DIAGNOSIS — K0889 Other specified disorders of teeth and supporting structures: Secondary | ICD-10-CM | POA: Diagnosis present

## 2022-06-11 MED ORDER — NAPROXEN 500 MG PO TABS: 500.0000 mg | ORAL_TABLET | Freq: Two times a day (BID) | ORAL | 0 refills | Status: DC

## 2022-06-11 MED ORDER — AMOXICILLIN 500 MG PO TABS: 500.0000 mg | ORAL_TABLET | Freq: Three times a day (TID) | ORAL | 0 refills | Status: DC

## 2022-06-11 NOTE — ED Provider Notes (Signed)
[facility] Provider Note    Event Date/Time   First MD Initiated Contact with Patient 06/11/22 0831     (approximate)   History   Dental Pain   HPI  Michelle Washington is a 26 y.o. female with no significant past medical history presents to the emergency department for treatment and evaluation of dental pain that has been intermittent for the past several months.  This episode started a couple of days ago and has not gone away as it typically does.  She is having some issues with her job/insurance and has been unable to see a dentist.  No fever. Past Medical History:  Diagnosis Date   Anemia    Back pain    Costochondritis      Physical Exam   Triage Vital Signs: ED Triage Vitals [06/11/22 0829]  Enc Vitals Group     BP 119/67     Pulse Rate 79     Resp 16     Temp 98 F (36.7 C)     Temp Source Oral     SpO2 100 %     Weight 210 lb (95.3 kg)     Height 5\' 6"  (1.676 m)     Head Circumference      Peak Flow      Pain Score 10     Pain Loc      Pain Edu?      Excl. in GC?     Most recent vital signs: Vitals:   06/11/22 0829  BP: 119/67  Pulse: 79  Resp: 16  Temp: 98 F (36.7 C)  SpO2: 100%    General: Awake, no distress.  CV:  Good peripheral perfusion.  Resp:  Normal effort.  Abd:  No distention.  Other:  Dental decay noted left lower molar.   ED Results / Procedures / Treatments   Labs (all labs ordered are listed, but only abnormal results are displayed) Labs Reviewed - No data to display   EKG  Not indicated.  RADIOLOGY  Not indicated.   I have independently reviewed and interpreted imaging as well as reviewed report from radiology.  PROCEDURES:  Critical Care performed: No  Procedures   MEDICATIONS ORDERED IN ED:  Medications - No data to display   IMPRESSION / MDM / ASSESSMENT AND PLAN / ED COURSE   I reviewed the triage vital signs and the nursing notes.  Differential diagnosis includes, but  is not limited to: dental pain, dental abscess  Patient's presentation is most consistent with acute, uncomplicated illness.  26 year old female presenting to the emergency department for treatment and evaluation of acute on chronic dental pain to the left lower jaw.  See HPI for further details.  No facial swelling associated with dental pain today however the patient states that this is pain is worse than usual.  Plan will be to put her on a antibiotic and have her take Naprosyn and/or Tylenol.  She will be provided with a list of dental resources and encouraged to call and schedule follow-up appointment.  If her symptoms change or worsen and she is unable to be evaluated by the dentist she is to go to urgent care or return to the emergency department.      FINAL CLINICAL IMPRESSION(S) / ED DIAGNOSES   Final diagnoses:  Chronic dental pain     Rx / DC Orders   ED Discharge Orders          Ordered  amoxicillin (AMOXIL) 500 MG tablet  3 times daily        06/11/22 0839    naproxen (NAPROSYN) 500 MG tablet  2 times daily with meals        06/11/22 0839             Note:  This document was prepared using Dragon voice recognition software and may include unintentional dictation errors.   Chinita Pester, FNP 06/11/22 1142    Minna Antis, MD 06/11/22 1451

## 2022-06-11 NOTE — Discharge Instructions (Signed)
OPTIONS FOR DENTAL FOLLOW UP CARE ° °[place] Department of Health and Human Services - Local Safety Net Dental Clinics °http://www.ncdhhs.gov/dph/oralhealth/services/safetynetclinics.htm °  °[facility] (336-562-3123) ° °[facility] (919-933-9087) ° °[facility] ([phone] ext [phone]) ° °[facility] ([phone]) ° °[facility] ([phone]) °This clinic caters to the indigent population and is on a lottery system. °Location: °[facility], [address], [place] °Clinic Hours: °Wednesdays from 6pm - 9pm, patients seen by a lottery system. °For dates, call or go to [email or web address].edu/[facility]/patients/Dental-[facility] °Services: °Cleanings, fillings and simple extractions. °Payment Options: °DENTAL WORK IS FREE OF CHARGE. Bring proof of income or support. °Best way to get seen: °Arrive at 5:15 pm - this is a lottery, NOT first come/first serve, so arriving earlier will not increase your chances of being seen. °  °  °[facility] °[phone] °Select option 1 for emergencies °  °Location: °[facility], [address], [place] °Clinic Hours: °No walk-ins accepted - call the day before to schedule an appointment. °Check in times are 9:30 am and 1:30 pm. °Services: °Simple extractions, temporary fillings, pulpectomy/pulp debridement, uncomplicated abscess drainage. °Payment Options: °PAYMENT IS DUE AT THE TIME OF SERVICE.  Fee is usually $100-200, additional surgical procedures (e.g. abscess drainage) may be extra. °Cash, checks, Visa/MasterCard accepted.  Can file Medicaid if patient is covered for dental - patient should call case worker to check. °No discount for [facility] patients. °Best way to get seen: °MUST call the day before and get onto the schedule. Can usually be seen the next 1-2 days. No walk-ins accepted. °  °  °[facility] °[phone] °   °Location: °[facility], [address], [place] °Clinic Hours: °M, W, Th, F 8am or 1:30pm, Tues 9a or 1:30 - first come/first served. °Services: °Simple extractions, temporary fillings, uncomplicated abscess drainage.  You do not need to be an [place] resident. °Payment Options: °PAYMENT IS DUE AT THE TIME OF SERVICE. °Dental insurance, otherwise sliding scale - bring proof of income or support. °Depending on income and treatment needed, cost is usually $50-200. °Best way to get seen: °Arrive early as it is first come/first served. °  °  °[facility] °[phone] °  °Location: °[address] °Clinic Hours: °Mon-Thu 8a-5p °Services: °Most basic dental services including extractions and fillings. °Payment Options: °PAYMENT IS DUE AT THE TIME OF SERVICE. °Sliding scale, up to 50% off - bring proof if income or support. °Medicaid with dental option accepted. °Best way to get seen: °Call to schedule an appointment, can usually be seen within 2 weeks OR they will try to see walk-ins - show up at 8a or 2p (you may have to wait). °  °  °[facility] °919-245-2435 °[place] RESIDENTS ONLY °  °Location: °[facility], [address], [place], [place] [address] °Clinic Hours: By appointment only. °Monday - Thursday 8am-5pm, Friday 8am-12pm °Services: Cleanings, fillings, extractions. °Payment Options: °PAYMENT IS DUE AT THE TIME OF SERVICE. °Cash, Visa or MasterCard. Sliding scale - $30 minimum per service. °Best way to get seen: °Come in to office, complete packet and make an appointment - need proof of income °or support monies for each household member and proof of [place] residence. °Usually takes about a month to get in. °  °  °[facility] °[phone] °  °Location: °[address]   [place] °Clinic Hours: Walk-in Urgent Care Dental Services are offered Monday-Friday  mornings only. °The numbers of emergencies accepted daily is limited to the number of °providers available. °Maximum 15 - Mondays, Wednesdays & Thursdays °Maximum 10 - Tuesdays & Fridays °Services: °You do not need to be a [place] resident to be seen for a dental emergency. °Emergencies are defined as pain, swelling, abnormal bleeding, or dental trauma. Walkins will receive x-rays if needed. °NOTE: Dental cleaning is not an emergency. °Payment Options: °PAYMENT IS DUE AT THE TIME OF SERVICE. °Minimum co-pay is $40.00 for uninsured patients. °Minimum co-pay is $3.00 for Medicaid with dental coverage. °Dental Insurance is accepted and must be presented at time of visit. °Medicare does not cover dental. °Forms of payment: Cash, credit card, checks. °Best way to get seen: °If not previously registered with the clinic, walk-in dental registration begins at 7:15 am and is on a first come/first serve basis. °If previously registered with the clinic, call to make an appointment. °  °  °[facility] °[phone] °[place] RESIDENTS ONLY °  °Location: °[address], [place], [place] °Clinic Hours: °Mon-Thu 10a-2p °Services: Extractions only! °Payment Options: °FREE (donations accepted) - bring proof of income or support °Best way to get seen: °Call and schedule an appointment OR come at 8am on the 1st Monday of every month (except for holidays) when it is first come/first served. °  °  °[facility] °[phone] °  °Location: °[address],  °Clinic Hours: °Friday mornings °Services, Payment Options, Best way to get seen: °Call for info °

## 2022-06-11 NOTE — ED Triage Notes (Signed)
Patient c/o dental pain to left lower ongoing for months.

## 2022-11-03 ENCOUNTER — Encounter: Payer: Self-pay | Admitting: Emergency Medicine

## 2022-11-03 ENCOUNTER — Other Ambulatory Visit: Payer: Self-pay

## 2022-11-03 ENCOUNTER — Emergency Department
Admission: EM | Admit: 2022-11-03 | Discharge: 2022-11-03 | Disposition: A | Discharge status: Home / Self Care | Payer: PRIVATE HEALTH INSURANCE | Attending: Emergency Medicine | Admitting: Emergency Medicine

## 2022-11-03 DIAGNOSIS — Z20822 Contact with and (suspected) exposure to covid-19: Secondary | ICD-10-CM | POA: Diagnosis not present

## 2022-11-03 DIAGNOSIS — A084 Viral intestinal infection, unspecified: Secondary | ICD-10-CM | POA: Insufficient documentation

## 2022-11-03 DIAGNOSIS — R197 Diarrhea, unspecified: Secondary | ICD-10-CM | POA: Diagnosis present

## 2022-11-03 LAB — COMPREHENSIVE METABOLIC PANEL
ALT: 7 U/L (ref 0–44)
AST: 17 U/L (ref 15–41)
Albumin: 3 g/dL — ABNORMAL LOW (ref 3.5–5.0)
Alkaline Phosphatase: 24 U/L — ABNORMAL LOW (ref 38–126)
Anion gap: 5 (ref 5–15)
BUN: 9 mg/dL (ref 6–20)
CO2: 22 mmol/L (ref 22–32)
Calcium: 7.8 mg/dL — ABNORMAL LOW (ref 8.9–10.3)
Chloride: 110 mmol/L (ref 98–111)
Creatinine, Ser: 0.61 mg/dL (ref 0.44–1.00)
GFR, Estimated: >60 mL/min (ref >60)
Glucose, Bld: 89 mg/dL (ref 70–99)
Potassium: 3.7 mmol/L (ref 3.5–5.1)
Sodium: 137 mmol/L (ref 135–145)
Total Bilirubin: 0.8 mg/dL (ref 0.3–1.2)
Total Protein: 5.1 g/dL — ABNORMAL LOW (ref 6.5–8.1)

## 2022-11-03 LAB — CBC
HCT: 42.7 % (ref 36.0–46.0)
Hemoglobin: 13.6 g/dL (ref 12.0–15.0)
MCH: 26.8 pg (ref 26.0–34.0)
MCHC: 31.9 g/dL (ref 30.0–36.0)
MCV: 84.1 fL (ref 80.0–100.0)
Platelets: 146 10*3/uL — ABNORMAL LOW (ref 150–400)
RBC: 5.08 MIL/uL (ref 3.87–5.11)
RDW: 12.9 % (ref 11.5–15.5)
WBC: 10.7 10*3/uL — ABNORMAL HIGH (ref 4.0–10.5)
nRBC: 0 % (ref 0.0–0.2)

## 2022-11-03 LAB — URINALYSIS, ROUTINE W REFLEX MICROSCOPIC
Bacteria, UA: NONE SEEN
Bilirubin Urine: NEGATIVE
Glucose, UA: NEGATIVE mg/dL
Hgb urine dipstick: NEGATIVE
Ketones, ur: 20 mg/dL — AB
Nitrite: NEGATIVE
Protein, ur: NEGATIVE mg/dL
Specific Gravity, Urine: 1.016 (ref 1.005–1.030)
pH: 5 (ref 5.0–8.0)

## 2022-11-03 LAB — LIPASE, BLOOD: Lipase: 27 U/L (ref 11–51)

## 2022-11-03 LAB — RESP PANEL BY RT-PCR (RSV, FLU A&B, COVID)  RVPGX2
Influenza A by PCR: NEGATIVE
Influenza B by PCR: NEGATIVE
Resp Syncytial Virus by PCR: NEGATIVE
SARS Coronavirus 2 by RT PCR: NEGATIVE

## 2022-11-03 LAB — POC URINE PREG, ED: Preg Test, Ur: NEGATIVE

## 2022-11-03 MED ORDER — ONDANSETRON 4 MG PO TBDP
4.0000 mg | ORAL_TABLET | Freq: Once | ORAL | Status: AC
  Administered 2022-11-03: 4 mg via ORAL
  Filled 2022-11-03: qty 1

## 2022-11-03 MED ORDER — ONDANSETRON 4 MG PO TBDP: 4.0000 mg | ORAL_TABLET | Freq: Three times a day (TID) | ORAL | 0 refills | Status: DC | PRN

## 2022-11-03 NOTE — ED Triage Notes (Signed)
Pt to ED from home. Pt has been vomiting all night. Pt has also had diarrhea as well. Pt is CAOx4 and in acute distress. Pt was uncertain if it was the food she ate last night or a virus.

## 2022-11-03 NOTE — ED Notes (Signed)
Pt sleeping with head covered with home blanket.

## 2022-11-03 NOTE — ED Notes (Signed)
See triage note Presents with some stomach discomfort with vomiting  No fever    Thinks it may have been something she ate

## 2022-11-03 NOTE — ED Provider Notes (Signed)
[facility] Provider Note    Event Date/Time   First MD Initiated Contact with Patient 11/03/22 1858     (approximate)   History   Emesis and Diarrhea   HPI  [place] Hargrove is a 26 y.o. female with history of anemia presents emergency department with nausea vomiting and diarrhea that started last night.  Patient is unsure if she ate something that was bad or she has a virus.  No vomiting since she has been here in the ED.  States she has had no diarrhea either.  She denies chest pain, shortness of breath, fever or chills      Physical Exam   Triage Vital Signs: ED Triage Vitals  Enc Vitals Group     BP 11/03/22 1532 100/64     Pulse Rate 11/03/22 1532 79     Resp 11/03/22 1532 18     Temp 11/03/22 1532 98.6 F (37 C)     Temp Source 11/03/22 1532 Oral     SpO2 11/03/22 1532 100 %     Weight 11/03/22 1535 204 lb (92.5 kg)     Height 11/03/22 1535 5\' 6"  (1.676 m)     Head Circumference --      Peak Flow --      Pain Score 11/03/22 1534 4     Pain Loc --      Pain Edu? --      Excl. in GC? --     Most recent vital signs: Vitals:   11/03/22 1532  BP: 100/64  Pulse: 79  Resp: 18  Temp: 98.6 F (37 C)  SpO2: 100%     General: Awake, no distress.   CV:  Good peripheral perfusion. regular rate and  rhythm Resp:  Normal effort. Lungs cta Abd:  No distention.  Soft, nontender, bowel sounds normal in all 4 quadrants Other:      ED Results / Procedures / Treatments   Labs (all labs ordered are listed, but only abnormal results are displayed) Labs Reviewed  COMPREHENSIVE METABOLIC PANEL - Abnormal; Notable for the following components:      Result Value   Calcium 7.8 (*)    Total Protein 5.1 (*)    Albumin 3.0 (*)    Alkaline Phosphatase 24 (*)    All other components within normal limits  CBC - Abnormal; Notable for the following components:   WBC 10.7 (*)    Platelets 146 (*)    All other components within normal limits   URINALYSIS, ROUTINE W REFLEX MICROSCOPIC - Abnormal; Notable for the following components:   Color, Urine YELLOW (*)    APPearance HAZY (*)    Ketones, ur 20 (*)    Leukocytes,Ua TRACE (*)    All other components within normal limits  RESP PANEL BY RT-PCR (RSV, FLU A&B, COVID)  RVPGX2  LIPASE, BLOOD  POC URINE PREG, ED     EKG     RADIOLOGY     PROCEDURES:   Procedures   MEDICATIONS ORDERED IN ED: Medications  ondansetron (ZOFRAN-ODT) disintegrating tablet 4 mg (has no administration in time range)     IMPRESSION / MDM / ASSESSMENT AND PLAN / ED COURSE  I reviewed the triage vital signs and the nursing notes.                              Differential diagnosis includes, but is not limited to, COVID,  influenza, RSV, viral gastroenteritis, food poisoning  Patient's presentation is most consistent with acute complicated illness / injury requiring diagnostic workup.   Patient appears to be very well, her labs are reassuring, I do not feel that she is dehydrated enough to replenish fluids.  She was given Zofran ODT to prevent any further vomiting tonight.  Prescription for Zofran ODT sent to her pharmacy.  Is given a work note.  Discharged in stable condition with strict instructions to return if worsening.  She is in agreement treatment plan.      FINAL CLINICAL IMPRESSION(S) / ED DIAGNOSES   Final diagnoses:  Viral gastroenteritis     Rx / DC Orders   ED Discharge Orders          Ordered    ondansetron (ZOFRAN-ODT) 4 MG disintegrating tablet  Every 8 hours PRN        11/03/22 1922             Note:  This document was prepared using Dragon voice recognition software and may include unintentional dictation errors.    Faythe Ghee, PA-C 11/03/22 Maximino Sarin, MD 11/03/22 [phone]

## 2022-11-03 NOTE — Discharge Instructions (Signed)
Take the zofran as needed for nausea and vomiting Let the diarrhea run its course, if you still have diarrhea in 2 days you could use immodium ad which is over the counter

## 2023-09-04 ENCOUNTER — Other Ambulatory Visit: Payer: Self-pay

## 2023-09-04 ENCOUNTER — Emergency Department
Admission: EM | Admit: 2023-09-04 | Discharge: 2023-09-04 | Disposition: A | Discharge status: Home / Self Care | Payer: PRIVATE HEALTH INSURANCE | Attending: Emergency Medicine | Admitting: Emergency Medicine

## 2023-09-04 DIAGNOSIS — R1030 Lower abdominal pain, unspecified: Secondary | ICD-10-CM | POA: Insufficient documentation

## 2023-09-04 DIAGNOSIS — N898 Other specified noninflammatory disorders of vagina: Secondary | ICD-10-CM | POA: Insufficient documentation

## 2023-09-04 DIAGNOSIS — Z202 Contact with and (suspected) exposure to infections with a predominantly sexual mode of transmission: Secondary | ICD-10-CM

## 2023-09-04 LAB — URINALYSIS, COMPLETE (UACMP) WITH MICROSCOPIC
Bacteria, UA: NONE SEEN
Bilirubin Urine: NEGATIVE
Glucose, UA: NEGATIVE mg/dL
Ketones, ur: 5 mg/dL — AB
Leukocytes,Ua: NEGATIVE
Nitrite: NEGATIVE
Protein, ur: NEGATIVE mg/dL
Specific Gravity, Urine: 1.024 (ref 1.005–1.030)
pH: 5 (ref 5.0–8.0)

## 2023-09-04 LAB — CHLAMYDIA/NGC RT PCR (ARMC ONLY)
Chlamydia Tr: NOT DETECTED
N gonorrhoeae: NOT DETECTED

## 2023-09-04 LAB — WET PREP, GENITAL
Clue Cells Wet Prep HPF POC: NONE SEEN
Sperm: NONE SEEN
Trich, Wet Prep: NONE SEEN
WBC, Wet Prep HPF POC: <10 (ref <10)
Yeast Wet Prep HPF POC: NONE SEEN

## 2023-09-04 LAB — PREGNANCY, URINE: Preg Test, Ur: NEGATIVE

## 2023-09-04 LAB — HIV ANTIBODY (ROUTINE TESTING W REFLEX): HIV Screen 4th Generation wRfx: NONREACTIVE

## 2023-09-04 NOTE — ED Triage Notes (Signed)
PT arrives via POV from home. Pt reports intermittent lower abdominal cramping. Reports a white discharge. Pt also reports that she used nair and it irritated her inner thighs. Pt requests that she be checked for stds. PT AxOx4. NAD.

## 2023-09-04 NOTE — ED Provider Notes (Signed)
[facility] Provider Note    None    (approximate)   History   Abdominal Pain and Exposure to STD   HPI  Michelle Washington is a 27 y.o. female who presents today for evaluation of intermittent lower abdominal cramping and white discharge the past 2 days.  Patient reports that her partner was diagnosed with "something" and advised her to come get checked out.  She is unsure what he was diagnosed with.  She reports that she was most recently sexually active last night and did not have any pain with intercourse.  No fevers or chills.  No nausea or vomiting.  No burning with urination.  She is unsure of her last menstrual period because she reports that they are irregular.  She reports that she has a history of trichomoniasis diagnosed when she was 20.  Patient Active Problem List   Diagnosis Date Noted   Overweight 05/24/2015          Physical Exam   Triage Vital Signs: ED Triage Vitals  Encounter Vitals Group     BP 09/04/23 0713 136/86     Systolic BP Percentile --      Diastolic BP Percentile --      Pulse Rate 09/04/23 0713 91     Resp 09/04/23 0713 17     Temp 09/04/23 0713 98.4 F (36.9 C)     Temp src --      SpO2 09/04/23 0713 100 %     Weight 09/04/23 0717 217 lb (98.4 kg)     Height 09/04/23 0717 5' 5.5" (1.664 m)     Head Circumference --      Peak Flow --      Pain Score 09/04/23 0716 8     Pain Loc --      Pain Education --      Exclude from Growth Chart --     Most recent vital signs: Vitals:   09/04/23 0713  BP: 136/86  Pulse: 91  Resp: 17  Temp: 98.4 F (36.9 C)  SpO2: 100%    Physical Exam Vitals and nursing note reviewed.  Constitutional:      General: Awake and alert. No acute distress.    Appearance: Normal appearance. The patient is obese.  HENT:     Head: Normocephalic and atraumatic.     Mouth: Mucous membranes are moist.  Eyes:     General: PERRL. Normal EOMs        Right eye: No discharge.        Left  eye: No discharge.     Conjunctiva/sclera: Conjunctivae normal.  Cardiovascular:     Rate and Rhythm: Normal rate and regular rhythm.     Pulses: Normal pulses.  Pulmonary:     Effort: Pulmonary effort is normal. No respiratory distress.     Breath sounds: Normal breath sounds.  Abdominal:     Abdomen is soft. There is no abdominal tenderness. No rebound or guarding. No distention. Musculoskeletal:        General: No swelling. Normal range of motion.     Cervical back: Normal range of motion and neck supple.  Skin:    General: Skin is warm and dry.     Capillary Refill: Capillary refill takes less than 2 seconds.     Findings: No rash.  Neurological:     Mental Status: The patient is awake and alert.      ED Results / Procedures / Treatments  Labs (all labs ordered are listed, but only abnormal results are displayed) Labs Reviewed  URINALYSIS, COMPLETE (UACMP) WITH MICROSCOPIC - Abnormal; Notable for the following components:      Result Value   Color, Urine YELLOW (*)    APPearance HAZY (*)    Hgb urine dipstick SMALL (*)    Ketones, ur 5 (*)    All other components within normal limits  WET PREP, GENITAL  CHLAMYDIA/NGC RT PCR ([facility] ONLY)            PREGNANCY, URINE  RPR  HIV ANTIBODY (ROUTINE TESTING W REFLEX)  POC URINE PREG, ED     EKG     RADIOLOGY     PROCEDURES:  Critical Care performed:   Procedures   MEDICATIONS ORDERED IN ED: Medications - No data to display   IMPRESSION / MDM / ASSESSMENT AND PLAN / ED COURSE  I reviewed the triage vital signs and the nursing notes.   Differential diagnosis includes, but is not limited to, sexually transmitted disease, bacterial vaginosis, pregnancy, urinary tract infection, trichomoniasis.  Patient is awake and alert, hemodynamically stable and afebrile.  She is nontoxic in appearance.  Patient has no reproducible abdominal tenderness on exam.  She denies dyspareunia, lower suspicion for PID at this  time, however patient prefers to self swab rather than have a pelvic exam.  She understands limitations of self swab.  She has no reproducible abdominal tenderness on exam.  Wet prep and GC chlamydia were negative.  Urinalysis does not appear to be consistent with UTI.  Urine pregnancy was negative.  Discussed that she can find the results of her syphilis and HIV test on MyChart. Patient was advised that there are many other STDs that patient could have, that we do not routinely test for in the emergency department. Patient was advised to follow up with a primary care doctor or the [place] health department to have the full panel of testing performed. Patient was advised to not have sexual intercourse until fully and properly tested and treated. Patient was advised that his partner also needs to be tested and treated. Patient understands and agrees with plan.  She was given a work note per her request.   Patient's presentation is most consistent with acute complicated illness / injury requiring diagnostic workup.      FINAL CLINICAL IMPRESSION(S) / ED DIAGNOSES   Final diagnoses:  Possible exposure to STD     Rx / DC Orders   ED Discharge Orders     None        Note:  This document was prepared using Dragon voice recognition software and may include unintentional dictation errors.   Jackelyn Hoehn, PA-C 09/04/23 1231    Jene Every, MD 09/04/23 [phone]

## 2023-09-04 NOTE — Discharge Instructions (Signed)
Your tests are negative. You can find the results of your HIV/syphilis tests on MyChart. Remember that there are many other STDs that and you should follow up with a primary care doctor to have the full panel of testing performed. Please do not have sexual intercourse until fully and properly tested and treated. Your partner also needs to be tested and treated.

## 2023-09-05 LAB — RPR: RPR Ser Ql: NONREACTIVE

## 2024-02-07 ENCOUNTER — Emergency Department
Admission: EM | Admit: 2024-02-07 | Discharge: 2024-02-07 | Disposition: A | Discharge status: Home / Self Care | Payer: Self-pay | Attending: Student in an Organized Health Care Education/Training Program | Admitting: Student in an Organized Health Care Education/Training Program

## 2024-02-07 ENCOUNTER — Emergency Department: Payer: Self-pay

## 2024-02-07 ENCOUNTER — Other Ambulatory Visit: Payer: Self-pay

## 2024-02-07 DIAGNOSIS — R1084 Generalized abdominal pain: Secondary | ICD-10-CM

## 2024-02-07 DIAGNOSIS — B9689 Other specified bacterial agents as the cause of diseases classified elsewhere: Secondary | ICD-10-CM | POA: Insufficient documentation

## 2024-02-07 DIAGNOSIS — N760 Acute vaginitis: Secondary | ICD-10-CM | POA: Insufficient documentation

## 2024-02-07 DIAGNOSIS — R1031 Right lower quadrant pain: Secondary | ICD-10-CM | POA: Diagnosis present

## 2024-02-07 LAB — COMPREHENSIVE METABOLIC PANEL WITH GFR
ALT: 8 U/L (ref 0–44)
AST: 17 U/L (ref 15–41)
Albumin: 3.8 g/dL (ref 3.5–5.0)
Alkaline Phosphatase: 31 U/L — ABNORMAL LOW (ref 38–126)
Anion gap: 6 (ref 5–15)
BUN: 9 mg/dL (ref 6–20)
CO2: 26 mmol/L (ref 22–32)
Calcium: 8.9 mg/dL (ref 8.9–10.3)
Chloride: 107 mmol/L (ref 98–111)
Creatinine, Ser: 0.77 mg/dL (ref 0.44–1.00)
GFR, Estimated: >60 mL/min (ref >60)
Glucose, Bld: 98 mg/dL (ref 70–99)
Potassium: 3.6 mmol/L (ref 3.5–5.1)
Sodium: 139 mmol/L (ref 135–145)
Total Bilirubin: 0.6 mg/dL (ref 0.0–1.2)
Total Protein: 6.6 g/dL (ref 6.5–8.1)

## 2024-02-07 LAB — LIPASE, BLOOD: Lipase: 25 U/L (ref 11–51)

## 2024-02-07 LAB — CBC
HCT: 37.7 % (ref 36.0–46.0)
Hemoglobin: 12.1 g/dL (ref 12.0–15.0)
MCH: 27.2 pg (ref 26.0–34.0)
MCHC: 32.1 g/dL (ref 30.0–36.0)
MCV: 84.7 fL (ref 80.0–100.0)
Platelets: 149 10*3/uL — ABNORMAL LOW (ref 150–400)
RBC: 4.45 MIL/uL (ref 3.87–5.11)
RDW: 13 % (ref 11.5–15.5)
WBC: 7.5 10*3/uL (ref 4.0–10.5)
nRBC: 0 % (ref 0.0–0.2)

## 2024-02-07 LAB — URINALYSIS, ROUTINE W REFLEX MICROSCOPIC
Bacteria, UA: NONE SEEN
Bilirubin Urine: NEGATIVE
Glucose, UA: NEGATIVE mg/dL
Hgb urine dipstick: NEGATIVE
Ketones, ur: NEGATIVE mg/dL
Nitrite: NEGATIVE
Protein, ur: NEGATIVE mg/dL
Specific Gravity, Urine: 1.027 (ref 1.005–1.030)
pH: 5 (ref 5.0–8.0)

## 2024-02-07 LAB — CHLAMYDIA/NGC RT PCR (ARMC ONLY)
Chlamydia Tr: NOT DETECTED
N gonorrhoeae: NOT DETECTED

## 2024-02-07 LAB — WET PREP, GENITAL
Sperm: NONE SEEN
Trich, Wet Prep: NONE SEEN
WBC, Wet Prep HPF POC: <10 (ref <10)
Yeast Wet Prep HPF POC: NONE SEEN

## 2024-02-07 LAB — POC URINE PREG, ED: Preg Test, Ur: NEGATIVE

## 2024-02-07 MED ORDER — METRONIDAZOLE 500 MG PO TABS: 500.0000 mg | ORAL_TABLET | Freq: Two times a day (BID) | ORAL | 0 refills | Status: AC

## 2024-02-07 MED ORDER — IOHEXOL 300 MG/ML  SOLN
100.0000 mL | Freq: Once | INTRAMUSCULAR | Status: AC | PRN
  Administered 2024-02-07: 100 mL via INTRAVENOUS

## 2024-02-07 NOTE — ED Provider Notes (Signed)
 [facility] Provider Note    Event Date/Time   First MD Initiated Contact with Patient 02/07/24 1836     (approximate)   History   Abdominal Pain   HPI  Michelle Washington is a 28 y.o. female presents to the ER for evaluation of 3 days of worsening right lower quadrant pain.  States that she has had episodes of this in the past since she was 28 years old and still has her appendix.  Denies any history of ovarian cysts.  No history of kidney stones.  Has had some chills.  Has had some loose stool.  Had to call out of work today due to worsening pain.     Physical Exam   Triage Vital Signs: ED Triage Vitals [02/07/24 1738]  Encounter Vitals Group     BP 126/67     Systolic BP Percentile      Diastolic BP Percentile      Pulse Rate 82     Resp 20     Temp 98.8 F (37.1 C)     Temp Source Oral     SpO2 98 %     Weight      Height      Head Circumference      Peak Flow      Pain Score 10     Pain Loc      Pain Education      Exclude from Growth Chart     Most recent vital signs: Vitals:   02/07/24 1900 02/07/24 2039  BP: 129/67 125/70  Pulse: 66 65  Resp: 18 18  Temp:    SpO2: 99% 99%     Constitutional: Alert  Eyes: Conjunctivae are normal.  Head: Atraumatic. Nose: No congestion/rhinnorhea. Mouth/Throat: Mucous membranes are moist.   Neck: Painless ROM.  Cardiovascular:   Good peripheral circulation. Respiratory: Normal respiratory effort.  No retractions.  Gastrointestinal: Soft with tenderness to palpation in the right lower quadrant. Musculoskeletal:  no deformity Neurologic:  MAE spontaneously. No gross focal neurologic deficits are appreciated.  Skin:  Skin is warm, dry and intact. No rash noted. Psychiatric: Mood and affect are normal. Speech and behavior are normal.    ED Results / Procedures / Treatments   Labs (all labs ordered are listed, but only abnormal results are displayed) Labs Reviewed  WET PREP, GENITAL -  Abnormal; Notable for the following components:      Result Value   Clue Cells Wet Prep HPF POC PRESENT (*)    All other components within normal limits  COMPREHENSIVE METABOLIC PANEL WITH GFR - Abnormal; Notable for the following components:   Alkaline Phosphatase 31 (*)    All other components within normal limits  CBC - Abnormal; Notable for the following components:   Platelets 149 (*)    All other components within normal limits  URINALYSIS, ROUTINE W REFLEX MICROSCOPIC - Abnormal; Notable for the following components:   Color, Urine YELLOW (*)    APPearance HAZY (*)    Leukocytes,Ua SMALL (*)    All other components within normal limits  CHLAMYDIA/NGC RT PCR ([facility] ONLY)            LIPASE, BLOOD  POC URINE PREG, ED     EKG     RADIOLOGY Please see ED Course for my review and interpretation.  I personally reviewed all radiographic images ordered to evaluate for the above acute complaints and reviewed radiology reports and findings.  These findings were  personally discussed with the patient.  Please see medical record for radiology report.    PROCEDURES:  Critical Care performed: No  Procedures   MEDICATIONS ORDERED IN ED: Medications  iohexol (OMNIPAQUE) 300 MG/ML solution 100 mL (100 mLs Intravenous Contrast Given 02/07/24 1931)     IMPRESSION / MDM / ASSESSMENT AND PLAN / ED COURSE  I reviewed the triage vital signs and the nursing notes.                              Differential diagnosis includes, but is not limited to, appendicitis, ileitis, colitis, diverticulitis, abscess, mass, ovarian cyst, torsion, stone  Patient presenting to the ER for evaluation of symptoms as described above.  Based on symptoms, risk factors and considered above differential, this presenting complaint could reflect a potentially life-threatening illness therefore the patient will be placed on continuous pulse oximetry and telemetry for monitoring.  Laboratory evaluation will be  sent to evaluate for the above complaints.  CT imaging will be ordered for the above differential.   Clinical Course as of 02/07/24 2240  Fri Feb 07, 2024  2001 CT imaging of my review and interpretation without evidence of perforation. [PR]    Clinical Course User Index [PR] Willy Eddy, MD   CT imaging with no acute findings.  I discussed abnormality on liver.  Patient is positive for BV.  Remainder of exam is otherwise reassuring.  Does appear appropriate for outpatient follow-up.  FINAL CLINICAL IMPRESSION(S) / ED DIAGNOSES   Final diagnoses:  BV (bacterial vaginosis)  Generalized abdominal pain     Rx / DC Orders   ED Discharge Orders          Ordered    metroNIDAZOLE (FLAGYL) 500 MG tablet  2 times daily        02/07/24 2026             Note:  This document was prepared using Dragon voice recognition software and may include unintentional dictation errors.    Willy Eddy, MD 02/07/24 2240

## 2024-02-07 NOTE — ED Triage Notes (Signed)
 Pt to ED via POV from home. Pt reports RLQ pain x2 days and back pain that has been constant and getting worse. Pt reports has had intermittent RLQ pain since she was 18. Pt reports N/V/D.

## 2024-02-07 NOTE — Discharge Instructions (Signed)
 Narrative & Impression  CLINICAL DATA:  Right lower quadrant pain   EXAM: CT ABDOMEN AND PELVIS WITH CONTRAST   TECHNIQUE: Multidetector CT imaging of the abdomen and pelvis was performed using the standard protocol following bolus administration of intravenous contrast.   RADIATION DOSE REDUCTION: This exam was performed according to the departmental dose-optimization program which includes automated exposure control, adjustment of the mA and/or kV according to patient size and/or use of iterative reconstruction technique.   CONTRAST:  OMNIPAQUE IOHEXOL 300 MG/ML  SOLN   COMPARISON:  10/17/2020   FINDINGS: Lower chest: No acute abnormality.   Hepatobiliary: Gallbladder is decompressed. In the medial aspect of the right lobe of the liver, there is a 3.3 cm hyperdense lesion with central decreased attenuation identified. 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 Nonemergent MRI is recommended for further evaluation.   Pancreas: Unremarkable. No pancreatic ductal dilatation or surrounding inflammatory changes.   Spleen: Normal in size without focal abnormality.   Adrenals/Urinary Tract: Adrenal glands are within normal limits. Kidneys demonstrate a normal enhancement pattern bilaterally. No renal calculi or obstructive changes are noted. The bladder is decompressed.   Stomach/Bowel: The appendix is within normal limits. No obstructive or inflammatory changes of the colon are seen. Small bowel and stomach are within normal limits.   Vascular/Lymphatic: No significant vascular findings are present. No enlarged abdominal or pelvic lymph nodes.   Reproductive: Uterus and bilateral adnexa are unremarkable.   Other: No abdominal wall hernia or abnormality. No abdominopelvic ascites.   Musculoskeletal: No acute or significant osseous findings.   IMPRESSION: Normal-appearing appendix.   Changes highly suspicious for focal nodular hyperplasia  within the liver as described. Nonemergent MRI is recommended for further workup. This was not present on the prior exam.     Electronically Signed   By: Alcide Clever M.D.   On: 02/07/2024 20:14

## 2024-03-22 ENCOUNTER — Emergency Department: Payer: Self-pay

## 2024-03-22 ENCOUNTER — Other Ambulatory Visit: Payer: Self-pay

## 2024-03-22 ENCOUNTER — Emergency Department
Admission: EM | Admit: 2024-03-22 | Discharge: 2024-03-22 | Disposition: A | Discharge status: Home / Self Care | Payer: Self-pay | Attending: Emergency Medicine | Admitting: Emergency Medicine

## 2024-03-22 DIAGNOSIS — O219 Vomiting of pregnancy, unspecified: Secondary | ICD-10-CM | POA: Diagnosis present

## 2024-03-22 DIAGNOSIS — O26891 Other specified pregnancy related conditions, first trimester: Secondary | ICD-10-CM

## 2024-03-22 DIAGNOSIS — Z3A01 Less than 8 weeks gestation of pregnancy: Secondary | ICD-10-CM | POA: Insufficient documentation

## 2024-03-22 LAB — CBC
HCT: 39 % (ref 36.0–46.0)
Hemoglobin: 12.3 g/dL (ref 12.0–15.0)
MCH: 26.3 pg (ref 26.0–34.0)
MCHC: 31.5 g/dL (ref 30.0–36.0)
MCV: 83.5 fL (ref 80.0–100.0)
Platelets: 158 10*3/uL (ref 150–400)
RBC: 4.67 MIL/uL (ref 3.87–5.11)
RDW: 13.3 % (ref 11.5–15.5)
WBC: 9.9 10*3/uL (ref 4.0–10.5)
nRBC: 0 % (ref 0.0–0.2)

## 2024-03-22 LAB — COMPREHENSIVE METABOLIC PANEL WITH GFR
ALT: 6 U/L (ref 0–44)
AST: 15 U/L (ref 15–41)
Albumin: 3.9 g/dL (ref 3.5–5.0)
Alkaline Phosphatase: 29 U/L — ABNORMAL LOW (ref 38–126)
Anion gap: 6 (ref 5–15)
BUN: 5 mg/dL — ABNORMAL LOW (ref 6–20)
CO2: 23 mmol/L (ref 22–32)
Calcium: 9 mg/dL (ref 8.9–10.3)
Chloride: 107 mmol/L (ref 98–111)
Creatinine, Ser: 0.57 mg/dL (ref 0.44–1.00)
GFR, Estimated: >60 mL/min (ref >60)
Glucose, Bld: 97 mg/dL (ref 70–99)
Potassium: 3.7 mmol/L (ref 3.5–5.1)
Sodium: 136 mmol/L (ref 135–145)
Total Bilirubin: 0.6 mg/dL (ref 0.0–1.2)
Total Protein: 6.6 g/dL (ref 6.5–8.1)

## 2024-03-22 LAB — URINALYSIS, ROUTINE W REFLEX MICROSCOPIC
Bilirubin Urine: NEGATIVE
Glucose, UA: NEGATIVE mg/dL
Hgb urine dipstick: NEGATIVE
Ketones, ur: NEGATIVE mg/dL
Leukocytes,Ua: NEGATIVE
Nitrite: NEGATIVE
Protein, ur: NEGATIVE mg/dL
Specific Gravity, Urine: 1.023 (ref 1.005–1.030)
pH: 6 (ref 5.0–8.0)

## 2024-03-22 LAB — POC URINE PREG, ED: Preg Test, Ur: POSITIVE — AB

## 2024-03-22 LAB — LIPASE, BLOOD: Lipase: 28 U/L (ref 11–51)

## 2024-03-22 LAB — HCG, QUANTITATIVE, PREGNANCY: hCG, Beta Chain, Quant, S: 143640 m[IU]/mL — ABNORMAL HIGH (ref <5)

## 2024-03-22 MED ORDER — ONDANSETRON HCL 4 MG PO TABS: 4.0000 mg | ORAL_TABLET | Freq: Three times a day (TID) | ORAL | 0 refills | Status: DC | PRN

## 2024-03-22 NOTE — ED Notes (Signed)
 See triage note  Presents with some abd cramping /pain with n/v  denies any fever  but states she could be pregnant

## 2024-03-22 NOTE — Discharge Instructions (Addendum)
 Please start  prenatal vitamins daily.  Call to request an appointment with the OB/GYN.  Return to the emergency department if your abdominal pain increases or you develop vaginal bleeding.

## 2024-03-22 NOTE — ED Provider Notes (Signed)
 [facility] Provider Note    Event Date/Time   First MD Initiated Contact with Patient 03/22/24 1318     (approximate)   History   Abdominal Cramping   HPI  Michelle Washington is a 28 y.o. female with history of anemia and as listed in EMR presents to the emergency department for treatment and evaluation of abdominal cramping and intermittent vomiting for the past 2 to 3 weeks.  Her last menstrual cycle was March 30.  She had a positive pregnancy test at home but her intake OB/GYN appointment is not for several weeks.  She denies vaginal bleeding or discharge.  She also reports a remote history of altered liver enzymes but had no further workup for diagnosis.      Physical Exam   Triage Vital Signs: ED Triage Vitals [03/22/24 1249]  Encounter Vitals Group     BP 124/74     Systolic BP Percentile      Diastolic BP Percentile      Pulse Rate 83     Resp 18     Temp 98.8 F (37.1 C)     Temp Source Oral     SpO2 100 %     Weight 207 lb (93.9 kg)     Height 5\' 5"  (1.651 m)     Head Circumference      Peak Flow      Pain Score 7     Pain Loc      Pain Education      Exclude from Growth Chart     Most recent vital signs: Vitals:   03/22/24 1249  BP: 124/74  Pulse: 83  Resp: 18  Temp: 98.8 F (37.1 C)  SpO2: 100%    General: Awake, no distress.  CV:  Good peripheral perfusion.  Resp:  Normal effort.  Abd:  No distention. No focal tenderness to palpation Other:     ED Results / Procedures / Treatments   Labs (all labs ordered are listed, but only abnormal results are displayed) Labs Reviewed  COMPREHENSIVE METABOLIC PANEL WITH GFR - Abnormal; Notable for the following components:      Result Value   BUN 5 (*)    Alkaline Phosphatase 29 (*)    All other components within normal limits  URINALYSIS, ROUTINE W REFLEX MICROSCOPIC - Abnormal; Notable for the following components:   Color, Urine YELLOW (*)    APPearance HAZY (*)    All  other components within normal limits  HCG, QUANTITATIVE, PREGNANCY - Abnormal; Notable for the following components:   hCG, Beta Chain, Quant, S 143,640 (*)    All other components within normal limits  POC URINE PREG, ED - Abnormal; Notable for the following components:   Preg Test, Ur POSITIVE (*)    All other components within normal limits  LIPASE, BLOOD  CBC     EKG  Not indicated.   RADIOLOGY  Image and radiology report reviewed and interpreted by me. Radiology report consistent with the same.  Single IUP measuring 7 weeks 0 days with fetal heart rate of 137.  PROCEDURES:  Critical Care performed: No  Procedures   MEDICATIONS ORDERED IN ED:  Medications - No data to display   IMPRESSION / MDM / ASSESSMENT AND PLAN / ED COURSE   I have reviewed the triage note.  Differential diagnosis includes, but is not limited to, abdominal pain in pregnancy, ectopic pregnancy, pancreatitis, SBO, ileus.  Patient's presentation is most consistent with  acute presentation with potential threat to life or bodily function.  28 year old female presenting to the emergency department for evaluation of abdominal cramping.  See HPI for further details.  Labs obtained while awaiting ER room assignment show urinalysis positive pregnancy test.  CBC is normal.  CMP is without acute concerns-test liver function tests are normal.  Lipase is normal.  Urinalysis is without concern for cystitis.  Beta hCG level is pending.  Plan will be to obtain an ultrasound after quantitative level has resulted.  Patient aware and agreeable to the plan.  Clinical Course as of 03/22/24 1519  Sun Mar 22, 2024  1518 Ultrasound shows a single IUP with a fetal heart rate of 137.  7 weeks 0 days. Results were reviewed with the patient.  She was instructed to start prenatal vitamins.  She will also be given a prescription for Zofran  to help with nausea.  Information provided for OB/GYN follow-up.  ER return  precautions discussed. [CT]    Clinical Course User Index [CT] Rosey Eide B, FNP     FINAL CLINICAL IMPRESSION(S) / ED DIAGNOSES   Final diagnoses:  Abdominal pain during pregnancy in first trimester  Nausea/vomiting in pregnancy     Rx / DC Orders   ED Discharge Orders          Ordered    ondansetron  (ZOFRAN ) 4 MG tablet  Every 8 hours PRN        03/22/24 1516             Note:  This document was prepared using Dragon voice recognition software and may include unintentional dictation errors.   Sherryle Don, FNP 03/22/24 1519    Arline Bennett, MD 03/22/24 [phone]

## 2024-03-22 NOTE — ED Triage Notes (Addendum)
 Pt states ABD pain and vomiting mostly in the morning, pt states possible pregnancy. Pt states issues with "liver enzymes in the past". Pt states lower ABD pain, pt denies dysuria.  Pt is A&Ox4. Pt denies vaginal bleeding.

## 2024-04-03 ENCOUNTER — Telehealth (INDEPENDENT_AMBULATORY_CARE_PROVIDER_SITE_OTHER): Payer: Self-pay

## 2024-04-03 DIAGNOSIS — Z3A00 Weeks of gestation of pregnancy not specified: Secondary | ICD-10-CM

## 2024-04-03 DIAGNOSIS — Z3480 Encounter for supervision of other normal pregnancy, unspecified trimester: Secondary | ICD-10-CM

## 2024-04-03 DIAGNOSIS — Z3490 Encounter for supervision of normal pregnancy, unspecified, unspecified trimester: Secondary | ICD-10-CM | POA: Insufficient documentation

## 2024-04-03 NOTE — Patient Instructions (Signed)
 First Trimester of Pregnancy  The first trimester of pregnancy starts on the first day of your last monthly period until the end of week 13. This is months 1 through 3 of pregnancy. A week after a sperm fertilizes an egg, the egg will implant into the wall of the uterus and begin to develop into a baby. Body changes during your first trimester Your body goes through many changes during pregnancy. The changes usually return to normal after your baby is born. Physical changes Your breasts may grow larger and may hurt. The area around your nipples may get darker. Your periods will stop. Your hair and nails may grow faster. You may pee more often. Health changes You may tire easily. Your gums may bleed and may be sensitive when you brush and floss. You may not feel hungry. You may have heartburn. You may throw up or feel like you may throw up. You may want to eat some foods, but not others. You may have headaches. You may have trouble pooping (constipation). Other changes Your emotions may change from day to day. You may have more dreams. Follow these instructions at home: Medicines Talk to your health care provider if you're taking medicines. Ask if the medicines are safe to take during pregnancy. Your provider may change the medicines that you take. Do not take any medicines unless told to by your provider. Take a prenatal vitamin that has at least 600 micrograms (mcg) of folic acid. Do not use herbal medicines, illegal substances, or medicines that are not approved by your provider. Eating and drinking While you're pregnant your body needs extra food for your growing baby. Talk with your provider about what to eat while pregnant. Activity Most women are able to exercise during pregnancy. Exercises may need to change as your pregnancy goes on. Talk to your provider about your activities and exercise routines. Relieving pain and discomfort Wear a good, supportive bra if your breasts  hurt. Rest with your legs raised if you have leg cramps or low back pain. Safety Wear your seatbelt at all times when you're in a car. Talk to your provider if someone hits you, hurts you, or yells at you. Talk with your provider if you're feeling sad or have thoughts of hurting yourself. Lifestyle Certain things can be harmful while you're pregnant. Follow these rules: Do not use hot tubs, steam rooms, or saunas. Do not douche. Do not use tampons or scented pads. Do not drink alcohol,smoke, vape, or use products with nicotine or tobacco in them. If you need help quitting, talk with your provider. Avoid cat litter boxes and soil used by cats. These things carry germs that can cause harm to your pregnancy and your baby. General instructions Keep all follow-up visits. It helps you and your unborn baby stay as healthy as possible. Write down your questions. Take them to your visits. Your provider will: Talk with you about your overall health. Give you advice or refer you to specialists who can help with different needs, including: Prenatal education classes. Mental health and counseling. Foods and healthy eating. Ask for help if you need help with food. Call your dentist and ask to be seen. Brush your teeth with a soft toothbrush. Floss gently. Where to find more information American Pregnancy Association: americanpregnancy.org [employer]: acog.org Office on [employer] Health: [employer] Contact a health care provider if: You feel dizzy, faint, or have a fever. You vomit or have watery poop (diarrhea) for 2  days or more. You have abnormal discharge or bleeding from your vagina. You have pain when you pee or your pee smells bad. You have cramps, pain, or pressure in your belly area. Get help right away if: You have trouble breathing or chest pain. You have any kind of injury, such as from a fall or a car crash. These symptoms may be an  emergency. Get help right away. Call 911. Do not wait to see if the symptoms will go away. Do not drive yourself to the hospital. This information is not intended to replace advice given to you by your health care provider. Make sure you discuss any questions you have with your health care provider. Document Revised: 07/25/2023 Document Reviewed: 02/22/2023 Elsevier Patient Education  2024 Elsevier Inc.   Common Medications Safe in Pregnancy  Acne:      Constipation:  Benzoyl Peroxide     Colace  Clindamycin      Dulcolax Suppository  Topica Erythromycin     Fibercon  Salicylic Acid      Metamucil         Miralax AVOID:        Senakot   Accutane    Cough:  Retin-A       Cough Drops  Tetracycline      Phenergan w/ Codeine if Rx  Minocycline      Robitussin (Plain & DM)  Antibiotics:     Crabs/Lice:  Ceclor       RID  Cephalosporins    AVOID:  E-Mycins      Kwell  Keflex  Macrobid/Macrodantin   Diarrhea:  Penicillin      Kao-Pectate  Zithromax      Imodium AD         PUSH FLUIDS AVOID:       Cipro     Fever:  Tetracycline      Tylenol (Regular or Extra  Minocycline       Strength)  Levaquin      Extra Strength-Do not          Exceed 8 tabs/24 hrs Caffeine:        200mg /day (equiv. To 1 cup of coffee or  approx. 3 12 oz sodas)         Gas: Cold/Hayfever:       Gas-X  Benadryl      Mylicon  Claritin       Phazyme  **Claritin-D        Chlor-Trimeton    Headaches:  Dimetapp      ASA-Free Excedrin  Drixoral-Non-Drowsy     Cold Compress  Mucinex (Guaifenasin)     Tylenol (Regular or Extra  Sudafed/Sudafed-12 Hour     Strength)  **Sudafed PE Pseudoephedrine   Tylenol Cold & Sinus     Vicks Vapor Rub  Zyrtec  **AVOID if Problems With Blood Pressure         Heartburn: Avoid lying down for at least 1 hour after meals  Aciphex      Maalox     Rash:  Milk of Magnesia     Benadryl    Mylanta       1% Hydrocortisone Cream  Pepcid  Pepcid Complete   Sleep  Aids:  Prevacid      Ambien   Prilosec       Benadryl  Rolaids       Chamomile Tea  Tums (Limit 4/day)     Unisom  Tylenol PM         Warm milk-add vanilla or  Hemorrhoids:       Sugar for taste  Anusol/Anusol H.C.  (RX: Analapram 2.5%)  Sugar Substitutes:  Hydrocortisone OTC     Ok in moderation  Preparation H      Tucks        Vaseline lotion applied to tissue with wiping    Herpes:     Throat:  Acyclovir      Oragel  Famvir  Valtrex     Vaccines:         Flu Shot Leg Cramps:       *Gardasil  Benadryl      Hepatitis A         Hepatitis B Nasal Spray:       Pneumovax  Saline Nasal Spray     Polio Booster         Tetanus Nausea:       Tuberculosis test or PPD  Vitamin B6 25 mg TID   AVOID:    Dramamine      *Gardasil  Emetrol       Live Poliovirus  Ginger Root 250 mg QID    MMR (measles, mumps &  High Complex Carbs @ Bedtime    rebella)  Sea Bands-Accupressure    Varicella (Chickenpox)  Unisom 1/2 tab TID     *No known complications           If received before Pain:         Known pregnancy;   Darvocet       Resume series after  Lortab        Delivery  Percocet    Yeast:   Tramadol      Femstat  Tylenol 3      Gyne-lotrimin  Ultram       Monistat  Vicodin           MISC:         All Sunscreens           Hair Coloring/highlights          Insect Repellant's          (Including DEET)         Mystic Tans   Commonly Asked Questions During Pregnancy   Cats: A parasite can be excreted in cat feces.  To avoid exposure you need to have another person empty the little box.  If you must empty the litter box you will need to wear gloves.  Wash your hands after handling your cat.  This parasite can also be found in raw or undercooked meat so this should also be avoided.  Colds, Sore Throats, Flu: Please check your medication sheet to see what you can take for symptoms.  If your symptoms are unrelieved by these medications please call the office.  Dental Work: Most  any dental work Agricultural consultant recommends is permitted.  X-rays should only be taken during the first trimester if absolutely necessary.  Your abdomen should be shielded with a lead apron during all x-rays.  Please notify your provider prior to receiving any x-rays.  Novocaine is fine; gas is not recommended.  If your dentist requires a note from [place] prior to dental work please call the office and we will provide one for you.  Exercise: Exercise is an important part of staying healthy during your pregnancy.  You may continue most exercises you were accustomed to prior to pregnancy.  Later in your pregnancy you will most likely notice you have difficulty with activities requiring balance like riding a bicycle.  It is important that you listen to your body and avoid activities that put you at a higher risk of falling.  Adequate rest and staying well hydrated are a must!  If you have questions about the safety of specific activities ask your provider.    Exposure to Children with illness: Try to avoid obvious exposure; report any symptoms to [place] when noted,  If you have chicken pos, red measles or mumps, you should be immune to these diseases.   Please do not take any vaccines while pregnant unless you have checked with your OB provider.  Fetal Movement: After 28 weeks we recommend you do "kick counts" twice daily.  Lie or sit down in a calm quiet environment and count your baby movements "kicks".  You should feel your baby at least 10 times per hour.  If you have not felt 10 kicks within the first hour get up, walk around and have something sweet to eat or drink then repeat for an additional hour.  If count remains less than 10 per hour notify your provider.  Fumigating: Follow your pest control agent's advice as to how long to stay out of your home.  Ventilate the area well before re-entering.  Hemorrhoids:   Most over-the-counter preparations can be used during pregnancy.  Check your medication to see what is  safe to use.  It is important to use a stool softener or fiber in your diet and to drink lots of liquids.  If hemorrhoids seem to be getting worse please call the office.   Hot Tubs:  Hot tubs Jacuzzis and saunas are not recommended while pregnant.  These increase your internal body temperature and should be avoided.  Intercourse:  Sexual intercourse is safe during pregnancy as long as you are comfortable, unless otherwise advised by your provider.  Spotting may occur after intercourse; report any bright red bleeding that is heavier than spotting.  Labor:  If you know that you are in labor, please go to the hospital.  If you are unsure, please call the office and let us help you decide what to do.  Lifting, straining, etc:  If your job requires heavy lifting or straining please check with your provider for any limitations.  Generally, you should not lift items heavier than that you can lift simply with your hands and arms (no back muscles)  Painting:  Paint fumes do not harm your pregnancy, but may make you ill and should be avoided if possible.  Latex or water based paints have less odor than oils.  Use adequate ventilation while painting.  Permanents & Hair Color:  Chemicals in hair dyes are not recommended as they cause increase hair dryness which can increase hair loss during pregnancy.  " Highlighting" and permanents are allowed.  Dye may be absorbed differently and permanents may not hold as well during pregnancy.  Sunbathing:  Use a sunscreen, as skin burns easily during pregnancy.  Drink plenty of fluids; avoid over heating.  Tanning Beds:  Because their possible side effects are still unknown, tanning beds are not recommended.  Ultrasound Scans:  Routine ultrasounds are performed at approximately 20 weeks.  You will be able to see your baby's general anatomy an if you would like to know the gender this can usually be determined as well.  If it is questionable when you conceived you may  also  receive an ultrasound early in your pregnancy for dating purposes.  Otherwise ultrasound exams are not routinely performed unless there is a medical necessity.  Although you can request a scan we ask that you pay for it when conducted because insurance does not cover " patient request" scans.  Work: If your pregnancy proceeds without complications you may work until your due date, unless your physician or employer advises otherwise.  Round Ligament Pain/Pelvic Discomfort:  Sharp, shooting pains not associated with bleeding are fairly common, usually occurring in the second trimester of pregnancy.  They tend to be worse when standing up or when you remain standing for long periods of time.  These are the result of pressure of certain pelvic ligaments called "round ligaments".  Rest, Tylenol and heat seem to be the most effective relief.  As the womb and fetus grow, they rise out of the pelvis and the discomfort improves.  Please notify the office if your pain seems different than that described.  It may represent a more serious condition.

## 2024-04-03 NOTE — Progress Notes (Signed)
 New OB Intake  I connected with  Michelle Washington on 04/03/24 at  1:15 PM EDT by MyChart Video Visit and verified that I am speaking with the correct person using two identifiers. Nurse is located at [facility] and pt is located in store shopping.  I discussed the limitations, risks, security and privacy concerns of performing an evaluation and management service by telephone and the availability of in person appointments. I also discussed with the patient that there may be a patient responsible charge related to this service. The patient expressed understanding and agreed to proceed.  I explained I am completing New OB Intake today. We discussed her EDD of 11/08/24 that is based on LMP of 02/02/24. Pt is G1/P0. I reviewed her allergies, medications, Medical/Surgical/OB history, and appropriate screenings. There are cats in the home: no. Based on history, this is a/an pregnancy uncomplicated . Her obstetrical history is significant for N/A.  Patient Active Problem List   Diagnosis Date Noted   Supervision of other normal pregnancy, antepartum 04/03/2024   Overweight 05/24/2015    Concerns addressed today: None  Delivery Plans:  Unsure if she will deliver at [facility].  Anatomy US  Explained Anatomy US  will be scheduled around [redacted] weeks gestational age.  Labs Discussed genetic screening with patient. Patient desires genetic testing to be drawn at new OB visit. Discussed possible labs to be drawn at new OB appointment.  COVID Vaccine Patient has not had COVID vaccine.   Social Determinants of Health Food Insecurity: denies food insecurity Transportation: Patient denies transportation needs. Childcare: Discussed no children allowed at ultrasound appointments.   First visit review I reviewed new OB appt with pt. I explained she will have blood work and pap smear/pelvic exam if indicated. Explained pt will be seen by Darleene Ege, CNM at first visit; encounter routed  to appropriate provider.   Higinio Love, CMA 04/03/2024  2:00 PM

## 2024-04-20 ENCOUNTER — Emergency Department
Admission: EM | Admit: 2024-04-20 | Discharge: 2024-04-20 | Disposition: A | Discharge status: Home / Self Care | Payer: Self-pay | Attending: Emergency Medicine | Admitting: Emergency Medicine

## 2024-04-20 ENCOUNTER — Other Ambulatory Visit: Payer: Self-pay

## 2024-04-20 ENCOUNTER — Emergency Department: Payer: Self-pay

## 2024-04-20 DIAGNOSIS — R109 Unspecified abdominal pain: Secondary | ICD-10-CM | POA: Insufficient documentation

## 2024-04-20 DIAGNOSIS — O26891 Other specified pregnancy related conditions, first trimester: Secondary | ICD-10-CM | POA: Diagnosis present

## 2024-04-20 DIAGNOSIS — B9689 Other specified bacterial agents as the cause of diseases classified elsewhere: Secondary | ICD-10-CM | POA: Insufficient documentation

## 2024-04-20 DIAGNOSIS — Z3A11 11 weeks gestation of pregnancy: Secondary | ICD-10-CM | POA: Insufficient documentation

## 2024-04-20 DIAGNOSIS — O2341 Unspecified infection of urinary tract in pregnancy, first trimester: Secondary | ICD-10-CM | POA: Diagnosis not present

## 2024-04-20 LAB — URINALYSIS, ROUTINE W REFLEX MICROSCOPIC
Bilirubin Urine: NEGATIVE
Glucose, UA: NEGATIVE mg/dL
Hgb urine dipstick: NEGATIVE
Ketones, ur: NEGATIVE mg/dL
Leukocytes,Ua: NEGATIVE
Nitrite: NEGATIVE
Protein, ur: 100 mg/dL — AB
Specific Gravity, Urine: 1.02 (ref 1.005–1.030)
pH: 9 — ABNORMAL HIGH (ref 5.0–8.0)

## 2024-04-20 LAB — CBC
HCT: 35.2 % — ABNORMAL LOW (ref 36.0–46.0)
Hemoglobin: 11.1 g/dL — ABNORMAL LOW (ref 12.0–15.0)
MCH: 26.1 pg (ref 26.0–34.0)
MCHC: 31.5 g/dL (ref 30.0–36.0)
MCV: 82.6 fL (ref 80.0–100.0)
Platelets: 147 10*3/uL — ABNORMAL LOW (ref 150–400)
RBC: 4.26 MIL/uL (ref 3.87–5.11)
RDW: 13.6 % (ref 11.5–15.5)
WBC: 8.7 10*3/uL (ref 4.0–10.5)
nRBC: 0 % (ref 0.0–0.2)

## 2024-04-20 LAB — LIPASE, BLOOD: Lipase: 25 U/L (ref 11–51)

## 2024-04-20 LAB — COMPREHENSIVE METABOLIC PANEL WITH GFR
ALT: 9 U/L (ref 0–44)
AST: 21 U/L (ref 15–41)
Albumin: 3.4 g/dL — ABNORMAL LOW (ref 3.5–5.0)
Alkaline Phosphatase: 26 U/L — ABNORMAL LOW (ref 38–126)
Anion gap: 8 (ref 5–15)
BUN: 5 mg/dL — ABNORMAL LOW (ref 6–20)
CO2: 22 mmol/L (ref 22–32)
Calcium: 8.8 mg/dL — ABNORMAL LOW (ref 8.9–10.3)
Chloride: 105 mmol/L (ref 98–111)
Creatinine, Ser: 0.42 mg/dL — ABNORMAL LOW (ref 0.44–1.00)
GFR, Estimated: >60 mL/min (ref >60)
Glucose, Bld: 90 mg/dL (ref 70–99)
Potassium: 3.8 mmol/L (ref 3.5–5.1)
Sodium: 135 mmol/L (ref 135–145)
Total Bilirubin: 0.6 mg/dL (ref 0.0–1.2)
Total Protein: 6.5 g/dL (ref 6.5–8.1)

## 2024-04-20 LAB — POC URINE PREG, ED: Preg Test, Ur: POSITIVE — AB

## 2024-04-20 LAB — HCG, QUANTITATIVE, PREGNANCY: hCG, Beta Chain, Quant, S: 60644 m[IU]/mL — ABNORMAL HIGH (ref <5)

## 2024-04-20 NOTE — ED Notes (Signed)
 Pt to u/s

## 2024-04-20 NOTE — ED Provider Notes (Signed)
 [facility] Provider Note    Event Date/Time   First MD Initiated Contact with Patient 04/20/24 1213     (approximate)   History   Abdominal Cramping   HPI  Michelle Washington is a 28 y.o. female  with history of anemia and as listed in EMR presents to the emergency department for treatment and evaluation of abdominal cramping since last week. She is about [redacted] weeks pregnant. No vaginal bleeding or discharge. Aaron Aas      Physical Exam   Triage Vital Signs:  Most recent vital signs: Vitals:   04/20/24 1146  BP: 115/60  Pulse: 76  Resp: 17  Temp: 97.9 F (36.6 C)  SpO2: 100%    General: Awake, no distress.  CV:  Good peripheral perfusion.  Resp:  Normal effort.  Abd:  No distention. Bowel sounds active. No guarding or rebound. Other:     ED Results / Procedures / Treatments   Labs (all labs ordered are listed, but only abnormal results are displayed) Labs Reviewed  COMPREHENSIVE METABOLIC PANEL WITH GFR - Abnormal; Notable for the following components:      Result Value   BUN 5 (*)    Creatinine, Ser 0.42 (*)    Calcium 8.8 (*)    Albumin 3.4 (*)    Alkaline Phosphatase 26 (*)    All other components within normal limits  CBC - Abnormal; Notable for the following components:   Hemoglobin 11.1 (*)    HCT 35.2 (*)    Platelets 147 (*)    All other components within normal limits  URINALYSIS, ROUTINE W REFLEX MICROSCOPIC - Abnormal; Notable for the following components:   Color, Urine YELLOW (*)    APPearance CLOUDY (*)    pH 9.0 (*)    Protein, ur 100 (*)    Bacteria, UA RARE (*)    All other components within normal limits  HCG, QUANTITATIVE, PREGNANCY - Abnormal; Notable for the following components:   hCG, Beta Chain, Quant, S 16,109 (*)    All other components within normal limits  POC URINE PREG, ED - Abnormal; Notable for the following components:   Preg Test, Ur Positive (*)    All other components within normal limits   LIPASE, BLOOD     EKG     RADIOLOGY  Image and radiology report reviewed and interpreted by me. Radiology report consistent with the same.  Single live IUP 11 weeks 5 days. FHR 157  PROCEDURES:  Critical Care performed: No  Procedures   MEDICATIONS ORDERED IN ED:  Medications - No data to display   IMPRESSION / MDM / ASSESSMENT AND PLAN / ED COURSE   I have reviewed the triage note.  Differential diagnosis includes, but is not limited to, abdominal pain in pregnancy, colitis, miscarriage, muscle strain  Patient's presentation is most consistent with acute illness / injury with system symptoms.  28 year old female presents to the emergency department for treatment and evaluation of abdominal pain in pregnancy. See HPI.  Pain is a cramping feeling that seems worse when at work. 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   US  is without acute concerns. FHR is 157. 11 weeks and 5 days. Beta HCG is 60,644 today. No vaginal bleeding or discharge.  CBC is reassuring.  CMP is without acute concerns.  She does have some rare bacteria and protein in her urine and was encouraged to increase her fluid intake.  She  has no urinary complaints.  She will be discharged home with plan to take Tylenol  if needed for the abdominal pain and follow-up with her OB/GYN as scheduled.  She was encouraged to return to the emergency department for symptoms that change or worsen if she is unable to schedule an appointment with gynecology.        FINAL CLINICAL IMPRESSION(S) / ED DIAGNOSES   Final diagnoses:  Abdominal pain during pregnancy in first trimester     Rx / DC Orders   ED Discharge Orders     None        Note:  This document was prepared using Dragon voice recognition software and may include unintentional dictation errors.   Sherryle Don, FNP 04/20/24 1606    Bryson Carbine, MD 04/24/24 [phone]

## 2024-04-20 NOTE — ED Notes (Signed)
 Called lab, they will run Hcg off previous specimen.

## 2024-04-20 NOTE — Discharge Instructions (Signed)
 11 weeks and 5 days. Heart rate is 157.  Follow up with you gynecologist  if symptoms are not improving. You can take Tylenol  if needed for pain.

## 2024-04-20 NOTE — ED Notes (Signed)
 Pt complains of lower abdominal pain which has occurred bilaterally but is mostly to R side at this time. Pain is above SP. Denies vaginal bleeding. Abdomen is nontender.

## 2024-04-20 NOTE — ED Triage Notes (Signed)
 Pt comes in via pov with complaints of abdominal cramping since last week. Pt is [redacted] weeks pregnant and is currently working, and is experiencing continuous cramping. Pt with no other complaints at this time.

## 2024-04-28 ENCOUNTER — Ambulatory Visit: Payer: Self-pay | Admitting: Licensed Practical Nurse

## 2024-04-28 ENCOUNTER — Other Ambulatory Visit (HOSPITAL_COMMUNITY)
Admission: RE | Admit: 2024-04-28 | Discharge: 2024-04-28 | Disposition: A | Discharge status: Home / Self Care | Payer: Self-pay | Source: Ambulatory Visit | Attending: Licensed Practical Nurse | Admitting: Licensed Practical Nurse

## 2024-04-28 VITALS — BP 112/61 | HR 96 | Wt 210.1 lb

## 2024-04-28 DIAGNOSIS — Z113 Encounter for screening for infections with a predominantly sexual mode of transmission: Secondary | ICD-10-CM | POA: Diagnosis present

## 2024-04-28 DIAGNOSIS — Z124 Encounter for screening for malignant neoplasm of cervix: Secondary | ICD-10-CM

## 2024-04-28 DIAGNOSIS — Z3401 Encounter for supervision of normal first pregnancy, first trimester: Secondary | ICD-10-CM | POA: Diagnosis present

## 2024-04-28 DIAGNOSIS — Z3480 Encounter for supervision of other normal pregnancy, unspecified trimester: Secondary | ICD-10-CM

## 2024-04-28 DIAGNOSIS — Z1379 Encounter for other screening for genetic and chromosomal anomalies: Secondary | ICD-10-CM

## 2024-04-28 DIAGNOSIS — Z3A12 12 weeks gestation of pregnancy: Secondary | ICD-10-CM

## 2024-04-28 DIAGNOSIS — Z3689 Encounter for other specified antenatal screening: Secondary | ICD-10-CM

## 2024-04-28 MED ORDER — ASPIRIN 81 MG PO CHEW: 81.0000 mg | CHEWABLE_TABLET | Freq: Every day | ORAL | Status: AC

## 2024-04-28 NOTE — Progress Notes (Addendum)
 NEW OB HISTORY AND PHYSICAL  SUBJECTIVE:       Panama Meroney is a 28 y.o. G10P0000 female, Patient's last menstrual period was 02/02/2024 (exact date)., Estimated Date of Delivery: 11/08/24, [redacted]w[redacted]d, presents today for establishment of Prenatal Care. She reports headache, nausea with vomiting for first few weeks of pregnancy, backache, and breast tenderness.   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  -Has a partner, he is nice to her. She lives alone and feels safe at home. Denies hx of emotional and sexual abuse - Reports history of costochondritis and migraines without aura. Denies taking any prescription medications. Discussed use of magnesium  and tylenol  for HA management in pregnancy.  - Tonsillectomy at age 37 or 60. Denies any other  surgeries. - Reports irregular periods with past use of birth control pills, depo and nexplanon. Did not like the way they made her feel so she stopped contraception at age 89.  -Needs a PCP referral  - Saw dentist 7 months ago. Discussed importance of bi annual visits. -Last eye exam was 2019.  -Drinks about 1 L of water daily, educated on increase to 3 L - Craving dark sodas, discussed limiting caffeine intake to 200mg  daily  -Discussed dietary restrictions and whole foods, fruits and veggies.  -Exercises 3 to 4x a week, discussed lifting precautions and recommended weight gain 11 to 20lbs.  - Recommended daily ASA 81mg  - Works as a Firefighter in a facility.  - Opts for NIPT with gender today  - Baseline Preeclampsia labs drawn today  -Initial OB labs drawn today.   Indications for ASA therapy (per uptodate) One of the following: Previous pregnancy with preeclampsia, especially early onset and with an  adverse outcome No Multifetal gestation No Chronic hypertension No Type 1 or 2 diabetes mellitus No Chronic kidney disease No Autoimmune disease (antiphospholipid syndrome, systemic lupus erythematosus) No  Two or more of the following: Nulliparity Yes Obesity (body mass index >30 kg/m2) Yes Family history of preeclampsia in mother or sister No Age >=35 years No Sociodemographic characteristics (African American race, low socioeconomic level) Yes Personal risk factors (eg, previous pregnancy with low birth weight or small for gestational age infant, previous adverse pregnancy outcome [eg, stillbirth], interval >10 years between pregnancies) No   Gynecologic History Patient's last menstrual period was 02/02/2024 (exact date). Normal, Abnormal, and Unknown Contraception: none Last Pap: unknown, does not remember Results were:   Obstetric History OB History  Gravida Para Term Preterm AB Living  1 0 0 0 0 0  SAB IAB Ectopic Multiple Live Births  0 0 0 0     # Outcome Date GA Lbr Len/2nd Weight Sex Type Anes PTL Lv  1 Current             Past Medical History:  Diagnosis Date   Anemia    Back pain    Costochondritis     Past Surgical History:  Procedure Laterality Date   TONSILLECTOMY      Current Outpatient Medications on File Prior to Visit  Medication Sig Dispense Refill   Prenatal Vit-Fe Fumarate-FA (PRENATAL PO) Take by mouth.     No current facility-administered medications on file prior to visit.    Allergies  Allergen Reactions   Watermelon [Citrullus Vulgaris] Anaphylaxis  Social History   Socioeconomic History   Marital status: Significant Other    Spouse name: Not on file   Number of children: 0   Years of education: Not on file   Highest education level: Not on file  Occupational History   Occupation: Field seismologist    Employer: [employer]  Tobacco Use   Smoking status: Former    Current packs/day: 1.00    Types: Cigars,  Cigarettes   Smokeless tobacco: Never  Vaping Use   Vaping status: Never Used  Substance and Sexual Activity   Alcohol use: No   Drug use: No   Sexual activity: Yes    Partners: Male    Birth control/protection: None  Other Topics Concern   Not on file  Social History Narrative   Not on file   Social Drivers of Health   Financial Resource Strain: Low Risk  (04/24/2024)   Received from [facility]   Overall Financial Resource Strain (CARDIA)    How hard is it for you to pay for the very basics like food, housing, medical care, and heating?: Not hard at all  Recent Concern: Financial Resource Strain - Medium Risk (04/03/2024)   Overall Financial Resource Strain (CARDIA)    Difficulty of Paying Living Expenses: Somewhat hard  Food Insecurity: No Food Insecurity (04/24/2024)   Received [facility]   Hunger Vital Sign    Within the past 12 months, you worried that your food would run out before you got the money to buy more.: Never true    Within the past 12 months, the food you bought just didn't last and you didn't have money to get more.: Never true  Transportation Needs: No Transportation Needs (04/24/2024)   Received from [facility] - Transportation    Lack of Transportation (Medical): No    Lack of Transportation (Non-Medical): No  Physical Activity: Unknown (04/03/2024)   Exercise Vital Sign    Days of Exercise per Week: Not on file    Minutes of Exercise per Session: 150+ min  Stress: No Stress Concern Present (04/03/2024)   [employer] of Occupational Health - Occupational Stress Questionnaire    Feeling of Stress : Not at all  Social Connections: Unknown (04/03/2024)   Social Connection and Isolation Panel    Frequency of Communication with Friends and Family: More than three times a week    Frequency of Social Gatherings with Friends and Family: More than three times a week    Attends Religious Services: More than 4 times per year     Active Member of [employer] or Organizations: No    Attends Banker Meetings: Never    Marital Status: Not on file  Intimate Partner Violence: Not At Risk (04/03/2024)   Humiliation, Afraid, Rape, and Kick questionnaire    Fear of Current or Ex-Partner: No    Emotionally Abused: No    Physically Abused: No    Sexually Abused: No    Family History  Problem Relation Age of Onset   Diabetes Mother    Hypertension Mother    Sarcoidosis Mother    Alcohol abuse Maternal Grandmother    Diabetes Maternal Grandmother    Seizures Maternal Grandmother     The following portions of the patient's history were reviewed and updated as appropriate: allergies, current medications, past OB history, past medical history, past surgical history, past family history, past social history, and problem list.  Constitutional: Denied  constitutional symptoms, night sweats, recent illness, fatigue, fever, insomnia and weight loss.  Eyes: Denied eye symptoms, eye pain, photophobia, vision change and visual disturbance.  Ears/Nose/Throat/Neck: Denied ear, nose, throat or neck symptoms, hearing loss, nasal discharge, sinus congestion and sore throat.  Cardiovascular: Denied cardiovascular symptoms, arrhythmia, chest pain/pressure, edema, exercise intolerance, orthopnea and palpitations.  Respiratory: Denied pulmonary symptoms, asthma, pleuritic pain, productive sputum, cough, dyspnea and wheezing.  Gastrointestinal: Denied gastro-esophageal reflux, melena, nausea and vomiting.  Genitourinary:  Denied genitourinary symptoms including symptomatic vaginal discharge, pelvic relaxation issues, and urinary complaints.  Musculoskeletal: Denied musculoskeletal symptoms, stiffness, swelling, muscle weakness and myalgia.  Dermatologic: Denied dermatology symptoms, rash and scar.  Neurologic: Denied neurology symptoms, dizziness, headache, neck pain and syncope.  Psychiatric: Denied psychiatric symptoms, anxiety and  depression.  Endocrine: Denied endocrine symptoms including hot flashes and night sweats.     OBJECTIVE: Initial Physical Exam (New OB)  GENERAL APPEARANCE: alert, well appearing HEAD: normocephalic, atraumatic MOUTH: mucous membranes moist, pharynx normal without lesions THYROID: no thyromegaly or masses present BREASTS: no masses noted, no significant tenderness, no palpable axillary nodes, no skin changes LUNGS: clear to auscultation, no wheezes, rales or rhonchi, symmetric air entry HEART: regular rate and rhythm, no murmurs ABDOMEN: soft, nontender, nondistended, no abnormal masses, no epigastric pain EXTREMITIES: no redness or tenderness in the calves or thighs SKIN: normal coloration and turgor, no rashes LYMPH NODES: no adenopathy palpable NEUROLOGIC: alert, oriented, normal speech, no focal findings or movement disorder noted  PELVIC EXAM EXTERNAL GENITALIA: normal appearing vulva with no masses, tenderness or lesions VAGINA: no abnormal discharge or lesions CERVIX: no lesions or cervical motion tenderness UTERUS: gravid ADNEXA: no masses palpable and nontender OB EXAM PELVIMETRY: appears adequate RECTUM: exam not indicated  ASSESSMENT: Normal pregnancy   PLAN: Routine prenatal care. We discussed an overview of prenatal care and when to call. Reviewed diet, exercise, and weight gain recommendations in pregnancy. Discussed benefits of breastfeeding and lactation resources at [facility]. I reviewed labs and answered all questions.  1. Encounter for supervision of normal first pregnancy in first trimester (Primary) - Cytology - PAP - NOB Panel - Culture, OB Urine - Monitor Drug Profile 14(MW) - Nicotine screen, urine - Urinalysis, Routine w reflex microscopic - Hgb Fractionation Cascade - Hemoglobin A1c - Comprehensive metabolic panel - Protein / creatinine ratio, urine - TSH + free T4 - MaterniT21 PLUS Core  2. Encounter for screening for cervical cancer - Cytology  - PAP - Cervicovaginal ancillary only  3. Screening examination for venereal disease - Cervicovaginal ancillary only  4. [redacted] weeks gestation of pregnancy - NOB Panel - Culture, OB Urine - Monitor Drug Profile 14(MW) - Nicotine screen, urine - Urinalysis, Routine w reflex microscopic - Hgb Fractionation Cascade - Hemoglobin A1c - Comprehensive metabolic panel - Protein / creatinine ratio, urine - TSH + free T4 - MaterniT21 PLUS Core  5. Genetic screening - MaterniT21 PLUS Core  6. Supervision of other normal pregnancy, antepartum   LYDIA M DOMINIC, CNM

## 2024-04-28 NOTE — Patient Instructions (Signed)
 To reduce Migraines:  Magnesium  500 mg daily B2 400 mg daily Coenzyme q10 150 mg daily Ferrous Sulfate 65 mg elemental iron daily

## 2024-04-28 NOTE — Assessment & Plan Note (Signed)
-  Unavoided pregnancy, feels okay about the pregnancy.  -Has a partner, he is nice to her. She lives alone and feels safe at home. Denies hx of emotional and sexual abuse - Reports history of costochondritis and migraines without aura. Denies taking any prescription medications. Discussed use of magnesium  and tylenol  for HA management in pregnancy.  - Tonsillectomy at age 28 or 85. Denies any other  surgeries. - Reports irregular periods with past use of birth control pills, depo and nexplanon. Did not like the way they made her feel so she stopped contraception at age 64.  -Needs a PCP referral  - Saw dentist 7 months ago. Discussed importance of bi annual visits. -Last eye exam was 2019.  -Drinks about 1 L of water daily, educated on increase to 3 L - Craving dark sodas, discussed limiting caffeine intake to 200mg  daily  -Discussed dietary restrictions and whole foods, fruits and veggies.  -Exercises 3 to 4x a week, discussed lifting precautions and recommended weight gain 11 to 20lbs.  - Recommended daily ASA 81mg  - Works as a Firefighter in a facility.  - Opts for NIPT with gender today  - Baseline Preeclampsia labs drawn today  -Initial OB labs drawn today.

## 2024-04-29 ENCOUNTER — Ambulatory Visit: Payer: Self-pay | Admitting: Licensed Practical Nurse

## 2024-04-29 DIAGNOSIS — R7303 Prediabetes: Secondary | ICD-10-CM

## 2024-04-29 DIAGNOSIS — Z131 Encounter for screening for diabetes mellitus: Secondary | ICD-10-CM

## 2024-04-29 DIAGNOSIS — O99810 Abnormal glucose complicating pregnancy: Secondary | ICD-10-CM

## 2024-04-29 LAB — CERVICOVAGINAL ANCILLARY ONLY
Bacterial Vaginitis (gardnerella): NEGATIVE
Candida Glabrata: NEGATIVE
Candida Vaginitis: POSITIVE — AB
Chlamydia: NEGATIVE
Comment: NEGATIVE
Comment: NEGATIVE
Comment: NEGATIVE
Comment: NEGATIVE
Comment: NEGATIVE
Comment: NORMAL
Neisseria Gonorrhea: NEGATIVE
Trichomonas: NEGATIVE

## 2024-04-29 LAB — URINALYSIS, ROUTINE W REFLEX MICROSCOPIC
Bilirubin, UA: NEGATIVE
Glucose, UA: NEGATIVE
Ketones, UA: NEGATIVE
Leukocytes,UA: NEGATIVE
Nitrite, UA: NEGATIVE
Protein,UA: NEGATIVE
RBC, UA: NEGATIVE
Specific Gravity, UA: <=1.005 — AB (ref 1.005–1.030)
Urobilinogen, Ur: 0.2 mg/dL (ref 0.2–1.0)
pH, UA: 6.5 (ref 5.0–7.5)

## 2024-04-29 LAB — PROTEIN / CREATININE RATIO, URINE
Creatinine, Urine: 15 mg/dL
Protein, Ur: <4 mg/dL

## 2024-04-30 LAB — CBC/D/PLT+RPR+RH+ABO+RUBIGG...
Antibody Screen: NEGATIVE
Basophils Absolute: 0 10*3/uL (ref 0.0–0.2)
Basos: 0 %
EOS (ABSOLUTE): 0.1 10*3/uL (ref 0.0–0.4)
Eos: 1 %
HCV Ab: NONREACTIVE
HIV Screen 4th Generation wRfx: NONREACTIVE
Hematocrit: 37.4 % (ref 34.0–46.6)
Hemoglobin: 11.4 g/dL (ref 11.1–15.9)
Hepatitis B Surface Ag: NEGATIVE
Immature Grans (Abs): 0.1 10*3/uL (ref 0.0–0.1)
Immature Granulocytes: 1 %
Lymphocytes Absolute: 2.2 10*3/uL (ref 0.7–3.1)
Lymphs: 24 %
MCH: 26.5 pg — ABNORMAL LOW (ref 26.6–33.0)
MCHC: 30.5 g/dL — ABNORMAL LOW (ref 31.5–35.7)
MCV: 87 fL (ref 79–97)
Monocytes Absolute: 0.7 10*3/uL (ref 0.1–0.9)
Monocytes: 8 %
Neutrophils Absolute: 6.2 10*3/uL (ref 1.4–7.0)
Neutrophils: 66 %
Platelets: 130 10*3/uL — ABNORMAL LOW (ref 150–450)
RBC: 4.31 x10E6/uL (ref 3.77–5.28)
RDW: 13.1 % (ref 11.7–15.4)
RPR Ser Ql: NONREACTIVE
Rh Factor: POSITIVE
Rubella Antibodies, IGG: 11.9 {index} (ref >0.99)
Varicella zoster IgG: REACTIVE
WBC: 9.3 10*3/uL (ref 3.4–10.8)

## 2024-04-30 LAB — COMPREHENSIVE METABOLIC PANEL WITH GFR
ALT: 6 IU/L (ref 0–32)
AST: 14 IU/L (ref 0–40)
Albumin: 3.9 g/dL — ABNORMAL LOW (ref 4.0–5.0)
Alkaline Phosphatase: 40 IU/L — ABNORMAL LOW (ref 44–121)
BUN/Creatinine Ratio: 13 (ref 9–23)
BUN: 7 mg/dL (ref 6–20)
Bilirubin Total: <0.2 mg/dL (ref 0.0–1.2)
CO2: 20 mmol/L (ref 20–29)
Calcium: 9.1 mg/dL (ref 8.7–10.2)
Chloride: 102 mmol/L (ref 96–106)
Creatinine, Ser: 0.53 mg/dL — ABNORMAL LOW (ref 0.57–1.00)
Globulin, Total: 2.1 g/dL (ref 1.5–4.5)
Glucose: 84 mg/dL (ref 70–99)
Potassium: 4 mmol/L (ref 3.5–5.2)
Sodium: 136 mmol/L (ref 134–144)
Total Protein: 6 g/dL (ref 6.0–8.5)
eGFR: 132 mL/min/{1.73_m2} (ref >59)

## 2024-04-30 LAB — HCV INTERPRETATION

## 2024-04-30 LAB — HEMOGLOBIN A1C
Est. average glucose Bld gHb Est-mCnc: 120 mg/dL
Hgb A1c MFr Bld: 5.8 % — ABNORMAL HIGH (ref 4.8–5.6)

## 2024-04-30 LAB — HGB FRACTIONATION CASCADE
Hgb A2: 2.2 % (ref 1.8–3.2)
Hgb A: 97.8 % (ref 96.4–98.8)
Hgb F: 0 % (ref 0.0–2.0)
Hgb S: 0 %

## 2024-04-30 LAB — CULTURE, OB URINE

## 2024-04-30 LAB — TSH+FREE T4
Free T4: 0.95 ng/dL (ref 0.82–1.77)
TSH: 0.504 u[IU]/mL (ref 0.450–4.500)

## 2024-04-30 LAB — URINE CULTURE, OB REFLEX

## 2024-05-03 LAB — MATERNIT 21 PLUS CORE, BLOOD
Fetal Fraction: 7
Result (T21): NEGATIVE
Trisomy 13 (Patau syndrome): NEGATIVE
Trisomy 18 (Edwards syndrome): NEGATIVE
Trisomy 21 (Down syndrome): NEGATIVE

## 2024-05-05 LAB — CYTOLOGY - PAP
Chlamydia: NEGATIVE
Comment: NEGATIVE
Comment: NEGATIVE
Comment: NORMAL
Diagnosis: REACTIVE
Neisseria Gonorrhea: NEGATIVE
Trichomonas: NEGATIVE

## 2024-05-12 LAB — MONITOR DRUG PROFILE 14(MW)

## 2024-05-12 LAB — NICOTINE SCREEN, URINE

## 2024-05-26 ENCOUNTER — Other Ambulatory Visit: Payer: Self-pay

## 2024-05-26 ENCOUNTER — Encounter: Payer: Self-pay | Admitting: Licensed Practical Nurse

## 2024-09-03 ENCOUNTER — Emergency Department

## 2024-09-03 ENCOUNTER — Emergency Department
Admission: EM | Admit: 2024-09-03 | Discharge: 2024-09-03 | Disposition: A | Discharge status: Home / Self Care | Attending: Emergency Medicine | Admitting: Emergency Medicine

## 2024-09-03 ENCOUNTER — Other Ambulatory Visit: Payer: Self-pay

## 2024-09-03 DIAGNOSIS — R0789 Other chest pain: Secondary | ICD-10-CM | POA: Diagnosis present

## 2024-09-03 DIAGNOSIS — R079 Chest pain, unspecified: Secondary | ICD-10-CM

## 2024-09-03 LAB — BASIC METABOLIC PANEL WITH GFR
Anion gap: 11 (ref 5–15)
BUN: 8 mg/dL (ref 6–20)
CO2: 21 mmol/L — ABNORMAL LOW (ref 22–32)
Calcium: 8.8 mg/dL — ABNORMAL LOW (ref 8.9–10.3)
Chloride: 103 mmol/L (ref 98–111)
Creatinine, Ser: 0.5 mg/dL (ref 0.44–1.00)
GFR, Estimated: >60 mL/min (ref >60)
Glucose, Bld: 135 mg/dL — ABNORMAL HIGH (ref 70–99)
Potassium: 3.7 mmol/L (ref 3.5–5.1)
Sodium: 135 mmol/L (ref 135–145)

## 2024-09-03 LAB — CBC
HCT: 34.1 % — ABNORMAL LOW (ref 36.0–46.0)
Hemoglobin: 10.7 g/dL — ABNORMAL LOW (ref 12.0–15.0)
MCH: 26.5 pg (ref 26.0–34.0)
MCHC: 31.4 g/dL (ref 30.0–36.0)
MCV: 84.4 fL (ref 80.0–100.0)
Platelets: 128 K/uL — ABNORMAL LOW (ref 150–400)
RBC: 4.04 MIL/uL (ref 3.87–5.11)
RDW: 13.7 % (ref 11.5–15.5)
WBC: 11.9 K/uL — ABNORMAL HIGH (ref 4.0–10.5)
nRBC: 0 % (ref 0.0–0.2)

## 2024-09-03 LAB — TROPONIN I (HIGH SENSITIVITY)
Troponin I (High Sensitivity): 8 ng/L (ref <18)
Troponin I (High Sensitivity): 8 ng/L (ref <18)

## 2024-09-03 LAB — D-DIMER, QUANTITATIVE: D-Dimer, Quant: 0.75 ug{FEU}/mL — ABNORMAL HIGH (ref 0.00–0.50)

## 2024-09-03 MED ORDER — IOHEXOL 350 MG/ML SOLN
100.0000 mL | Freq: Once | INTRAVENOUS | Status: AC | PRN
  Administered 2024-09-03: 100 mL via INTRAVENOUS

## 2024-09-03 MED ORDER — METOCLOPRAMIDE HCL 5 MG/ML IJ SOLN
10.0000 mg | Freq: Once | INTRAMUSCULAR | Status: AC
Start: 2024-09-03 — End: 2024-09-03
  Administered 2024-09-03: 10 mg via INTRAVENOUS
  Filled 2024-09-03: qty 2

## 2024-09-03 NOTE — ED Provider Notes (Signed)
 [facility] Provider Note    Event Date/Time   First MD Initiated Contact with Patient 09/03/24 1902     (approximate)   History   Chest Pain   HPI  Michelle Washington is a 28 y.o. female [redacted] weeks pregnant with history of anemia and costochondritis and as listed in EMR presents to the emergency department for treatment and evaluation of intermittent chest pain since Sunday.  Pain has become more consistent over the past 24 hours.  Pain is located in different places.  Sometimes pain is under the left breast and sometimes it is midsternal or on the right side.  She denies feeling short of breath and has had no nausea, vomiting, or diarrhea.  Pain sometimes increases with movement and deep breath.  She has no history of DVT or PE and does not believe that she has immediate relatives with history of either.  She has had no extended immobilization/travel.  She does not smoke.  She has had no leg pain or back pain.  No complications during pregnancy. She denies abdominal cramping. She feels that the baby is active and has had no decrease in fetal movement.  She occasionally has some swelling in her feet and hands when she has had to work but it resolves with rest..     Physical Exam    Vitals:   09/03/24 1749 09/03/24 2241  BP: 135/73 118/71  Pulse: 99 94  Resp: 18 19  Temp: 98.1 F (36.7 C) 97.9 F (36.6 C)  SpO2: 96% 99%    General: Awake, no distress.  CV:  Good peripheral perfusion.  Resp:  Normal effort. No pleuritic pause. Breath sounds are clear. Abd:  No distention.  Other:     ED Results / Procedures / Treatments   Labs (all labs ordered are listed, but only abnormal results are displayed)  Labs Reviewed  BASIC METABOLIC PANEL WITH GFR - Abnormal; Notable for the following components:      Result Value   CO2 21 (*)    Glucose, Bld 135 (*)    Calcium 8.8 (*)    All other components within normal limits  CBC - Abnormal; Notable for the  following components:   WBC 11.9 (*)    Hemoglobin 10.7 (*)    HCT 34.1 (*)    Platelets 128 (*)    All other components within normal limits  D-DIMER, QUANTITATIVE - Abnormal; Notable for the following components:   D-Dimer, Quant 0.75 (*)    All other components within normal limits  POC URINE PREG, ED  TROPONIN I (HIGH SENSITIVITY)  TROPONIN I (HIGH SENSITIVITY)     EKG  NSR, rate of 88.   RADIOLOGY  Image and radiology report reviewed and interpreted by me. Radiology report consistent with the same.  CTA chest for PE negative for acute concerns.  PROCEDURES:  Critical Care performed: No  Procedures   MEDICATIONS ORDERED IN ED:  Medications  metoCLOPramide (REGLAN) injection 10 mg (10 mg Intravenous Given 09/03/24 2239)  iohexol  (OMNIPAQUE ) 350 MG/ML injection 100 mL (100 mLs Intravenous Contrast Given 09/03/24 2240)     IMPRESSION / MDM / ASSESSMENT AND PLAN / ED COURSE   I have reviewed the triage note and vital signs. Vital signs are stable   Differential diagnosis includes, but is not limited to, musculoskeletal pain, pregnancy changes, PE, pneumonia  Patient's presentation is most consistent with acute presentation with potential threat to life or bodily function.  28 year old female  presenting to the emergency department for treatment and evaluation of chest pain.  She is [redacted] weeks pregnant.  Pain has been ongoing for about 5 days but seems to be more persistent over the past 24 hours.  See HPI for further details.  Labs obtained while awaiting ER room assignment are at or near patient's baseline.  She has a stable anemia with a hemoglobin of 10.7 hematocrit of 34.1.  Platelet count is 128 which is also at her baseline.  BMP is reassuring.  Troponin is normal at 8.  On exam, she does have pain with deep inspiration.  Plan will be to get the second troponin and a D-dimer.  D-dimer slightly elevated.  CTA chest for PE indicated.  Patient agreeable to the  plan.  CTA is negative for D-dimer.  Patient reassured by negative result.  She will take Tylenol  if needed for pain.  She was also advised to lay on her left side.  Outpatient follow-up with her OB/GYN recommended.  If she has symptoms of concern and is unable to see her OB/GYN she is to return to the emergency department.      FINAL CLINICAL IMPRESSION(S) / ED DIAGNOSES   Final diagnoses:  Nonspecific chest pain       Note:  This document was prepared using Dragon voice recognition software and may include unintentional dictation errors.   Herlinda Kirk NOVAK, FNP 09/03/24 2321    Ernest Ronal BRAVO, MD 09/05/24 2306

## 2024-09-03 NOTE — ED Notes (Signed)
 Spoke with x-ray, pt was never brought to x-ray. Attempted to call pt mobile phone no answer, called pt x2 times out in the waiting room no answer.

## 2024-09-03 NOTE — ED Notes (Signed)
 FHT 140 Bpm

## 2024-09-03 NOTE — ED Notes (Addendum)
 Pt was not in room 43 at this time, called pt x3 times, provider informed me pt is in X-ray at this time.

## 2024-09-03 NOTE — ED Notes (Signed)
 Patient transported to CT

## 2024-09-03 NOTE — ED Triage Notes (Signed)
 Patient states intermittent chest pain since Sunday but since last night it has been constant; reports [redacted] weeks pregnant.

## 2024-09-03 NOTE — ED Notes (Signed)
 Provider reports pt found in room 44 after pt used call bell for assistance; pt placed back on board

## 2024-09-03 NOTE — Discharge Instructions (Addendum)
 Follow-up with your OB/GYN.  You may take Tylenol  if needed to help with pain.  Sleep on your left side when possible.  Return to the emergency department for symptoms of change or worsen if you are unable to see your OB/GYN.

## 2024-09-10 NOTE — Progress Notes (Signed)
   Provider: Sharene JONETTA Ruth, CNM Division: General Obstetrics, Gynecology and Midwifery  Return Prenatal Note   Assessment/Plan   Plan 28 y.o. G1P0000 at [redacted]w[redacted]d presents for follow-up OB visit. Reviewed prenatal record including previous visit note.  Encounter for supervision of normal pregnancy in third trimester (HHS-HCC) Doing well, +FM, -LOF, - vaginal bleeding, -cramping/ctxs, questions answered, routine precautions reviewed  Discussed childbirth education, breastfeeding support, doula support. [facility] recommended as they have doula services and accepts Medicaid-[employer]    Obesity affecting pregnancy in second trimester (HHS-HCC) Growth US  scheduled 11/14   Low ferritin level Discussed iron supplementation with patient. Will start oral iron daily   Immunization counseling Offered RSV and Tdap vaccines Reviewed benefits of each for baby Declines both at this time  Thrombocytopenia Was at [facility] ED on 10/30 for chest pain with negative work up for PE or other chest pain etiologies. PLTS 128.   Acid reflux Prescribed protonix today   No orders of the defined types were placed in this encounter.  Return in about 2 weeks (around 09/24/2024) for Return OB visit with Midwives.   Future Appointments  Date Time Provider Department Center  09/18/2024  3:30 PM [facility] US  RM 2 [facility] [place] - MFM at  09/24/2024  2:40 PM Ruth Sharene JONETTA, CNM [facility]   For next visit: Continue with routine prenatal care     Subjective   28 y.o. G1P0000 at [redacted]w[redacted]d presents for this follow-up prenatal visit.   Patient has no concerns.  Patient reports: Movement: Present Loss of Fluid: No Bleeding: No Contractions: Not present  Objective   Flow sheet Vitals: Pulse: 102 BP: 133/68 Fundal Height (cm): 32 cm Fetal Heart Rate: 140 Total weight gain: 15.4 kg (34 lb)  General Appearance  No acute distress, well appearing, and well  nourished Pulmonary   Normal work of breathing Neurologic   Alert and oriented to person, place, and time Psychiatric   Mood and affect within normal limits

## 2024-09-12 NOTE — Nursing Note (Signed)
 Patient here for r/o preterm labor. Patient states she is having cramping and mucousy discharge. NST done. Labs sent. VSS. Patient denies contractions at this time. Cervical exam to be closed and thick by Delores HOWARD. Patient discharged home. RN educated patient on signs and symptoms to look out for. Patient walked off unit and will be driving home.

## 2024-09-12 NOTE — Procedures (Signed)
 NST Interpretation Indication: Uterine contractions: O47.9   Baseline: 130 bpm Variability: moderate Accelerations: present Decelerations: absent Contractions: none Time noted:  See OBIX Impression: reactive Authenticated by: Alan CHRISTELLA Daring, CNM   Alan CHRISTELLA Daring, CNM 09/12/24  10:46 AM

## 2024-09-24 NOTE — Progress Notes (Signed)
   Provider: Sharene JONETTA Ruth, CNM Division: General Obstetrics, Gynecology and Midwifery  Return Prenatal Note   Assessment/Plan   Plan 28 y.o. G1P0000 at [redacted]w[redacted]d presents for follow-up OB visit. Reviewed prenatal record including previous visit note.  Encounter for supervision of normal pregnancy in third trimester (HHS-HCC) Doing well, +FM, -LOF, - vaginal bleeding, -cramping/ctxs, questions answered, routine precautions reviewed  Contacted [employer] for doula services, still waiting to hear back Discussed stages of labor, packing bag, preparing for labor and birth    Immunization counseling Offered Tdap and RSV and Flu vaccines. Declined   Acid reflux Has not picked up protonix yet and heartburn still present. Will pick up  Obesity affecting pregnancy in second trimester (HHS-HCC)  Growth on 09/18/24: 2027g 19%ile   Birth control counseling Discussed contraception for PP. Desires no contraception method, prefers NFP and Condoms   No orders of the defined types were placed in this encounter.  Return in about 2 weeks (around 10/08/2024) for Return OB visit with Midwives.   Future Appointments  Date Time Provider Department Center  10/08/2024  2:40 PM Ruth Sharene JONETTA, CNM [facility]   For next visit: Continue with routine prenatal care     Subjective   28 y.o. G1P0000 at [redacted]w[redacted]d presents for this follow-up prenatal visit.   Patient has no concerns.  Patient reports: Movement: Present Loss of Fluid: No Bleeding: No Contractions: Irregular  Objective   Flow sheet Vitals: Pulse: 87 BP: 132/80 Fundal Height (cm): 34 cm Fetal Heart Rate: 135 Presentation: Vertex Total weight gain: 17.1 kg (37 lb 11.2 oz)  General Appearance  No acute distress, well appearing, and well nourished Pulmonary   Normal work of breathing Neurologic   Alert and oriented to person, place, and time Psychiatric   Mood and affect within normal limits

## 2024-09-29 NOTE — Telephone Encounter (Signed)
 Pt called nurse advice to inquire how to get her FMLA completed. Instructed pt to send them via mychart, fax, or in person. Pt VU

## 2024-10-08 NOTE — Progress Notes (Signed)
   Provider: Sharene JONETTA Ruth, CNM Division: General Obstetrics, Gynecology, and Midwifery  Return Prenatal Note   Assessment/Plan   Plan 28 y.o. G1P0000 at [redacted]w[redacted]d presents for follow-up OB visit. Reviewed prenatal record including previous visit note.  Encounter for supervision of normal pregnancy in third trimester (HHS-HCC) Doing well, +FM, -LOF, - vaginal bleeding, -cramping/ctxs, questions answered, routine precautions reviewed  GBS culture obtained Discussed natural ways to encourage labor   Anxiety EPDS=4 reports anxiety is stable  The following were addressed during this visit:  Third Trimester - EPDS - Third Trimester    Comments: Edinburgh Postnatal Depression Scale Total: (not recorded)   The following were addressed and are pending completion:  Pregnancy Tasks - Nutrition Consult () - GBS ()    Orders Placed This Encounter  Procedures  . Group B Strep Screen   GBS obtained no documented PCN allergy GC/CT declined Reviewed options for coping with labor and patient's birth preferences.  EPDS completed today and results discussed with the patient. Patient is at moderate risk for developing mood changes, but we discussed coping strategies and how to have good self-intuition into their  coping and mood and asking for help when needed.  Patient knows to contact us  anytime for assessment of mood changes.  Will continue to monitor throughout pregnancy.    Return in about 1 week (around 10/15/2024) for Return OB visit with Midwives.  Future Appointments  Date Time Provider Department Center  10/13/2024 11:40 AM Ruth Sharene D, CNM [facility]  10/20/2024  9:00 AM Ruth Sharene JONETTA, CNM [facility]   For next visit: Continue with routine prenatal care    Subjective      28 y.o. G1P0000 at [redacted]w[redacted]d presents for this follow-up prenatal visit.  Patient has no concerns. Patient reports: Movement: Present Loss of Fluid: No Bleeding:  No Contractions: Not present  Objective   Flow sheet Vitals: Pulse: 105 BP: 135/82 Fundal Height (cm): 36 cm Fetal Heart Rate: 140 Presentation: Vertex EPDS Score: Edinburgh Postnatal Depression Scale Total: 4 (10/08/24) and answered The thought of harming myself has occurred to me.: Never (10/08/24) Total weight gain: 21.2 kg (46 lb 11.2 oz)  General Appearance  No acute distress, well appearing, and well nourished Pulmonary   Normal work of breathing Neurologic   Alert and oriented to person, place, and time Psychiatric   Mood and affect within normal limits

## 2024-10-12 ENCOUNTER — Encounter: Payer: Self-pay | Admitting: Obstetrics and Gynecology

## 2024-10-12 ENCOUNTER — Observation Stay
Admission: EM | Admit: 2024-10-12 | Discharge: 2024-10-12 | Disposition: A | Discharge status: Home / Self Care | Attending: Advanced Practice Midwife | Admitting: Advanced Practice Midwife

## 2024-10-12 ENCOUNTER — Other Ambulatory Visit: Payer: Self-pay

## 2024-10-12 DIAGNOSIS — Z3A36 36 weeks gestation of pregnancy: Secondary | ICD-10-CM

## 2024-10-12 DIAGNOSIS — W009XXA Unspecified fall due to ice and snow, initial encounter: Secondary | ICD-10-CM | POA: Insufficient documentation

## 2024-10-12 MED ORDER — OXYCODONE HCL 5 MG PO TABS: 5.0000 mg | ORAL_TABLET | Freq: Four times a day (QID) | ORAL | 0 refills | Status: AC | PRN

## 2024-10-12 MED ORDER — OXYCODONE HCL 5 MG PO TABS
5.0000 mg | ORAL_TABLET | Freq: Once | ORAL | Status: AC
  Administered 2024-10-12: 5 mg via ORAL
  Filled 2024-10-12: qty 1

## 2024-10-12 NOTE — Discharge Instructions (Addendum)
 Tylenol  as needed for pain 1,000 mg every 6 hours Roxicodone  every 6 hours for severe pain for up to 2 days (Category B during pregnancy- no evidence of harm to fetus from studies done) Rest as needed Activity as tolerated Ice to areas of pain for the first 2 days Soak in tub with epsom salt after first 2 days

## 2024-10-12 NOTE — Discharge Summary (Signed)
 Physician Final Progress Note  Patient ID: Michelle Washington MRN: [id number] DOB/AGE: Oct 28, 1996 28 y.o.  Admit date: 10/12/2024 Admitting provider: Slater Rains, CNM Discharge date: 10/12/2024   Admission Diagnoses:  1) intrauterine pregnancy at [redacted]w[redacted]d  2) pain from fall on pavement  Discharge Diagnoses:  Principal Problem:   Labor and delivery, indication for care Active Problems:   [redacted] weeks gestation of pregnancy   Fall due to slipping on ice or snow  Reactive NST  History of Present Illness: The patient is a 28 y.o. female G1P0000 at [redacted]w[redacted]d who presents several hours after slipping on ice and landing on her back/right side on pavement. She rates pain as severe and she has pain/difficulty with movement/walking. She also reports sharp pains in pubis area. She denies impact to her abdomen. No bleeding or leaking of fluid. She does report some balling up of baby over recent days. Good fetal movement. She is admitted for observation and placed on monitors. Reactive NST. Irregular mild contractions and irritability noted on Toco. Advised increase hydration. Short course of Oxycodone  offered for severe pain which she accepts. Advised she will not be able to drive herself home. Her partner will come to pick her up. She is discharged to home with instructions and precautions. Follow up with [facility] care providers.   Past Medical History:  Diagnosis Date   Anemia    Back pain    Costochondritis     Past Surgical History:  Procedure Laterality Date   TONSILLECTOMY      No current facility-administered medications on file prior to encounter.   Current Outpatient Medications on File Prior to Encounter  Medication Sig Dispense Refill   Prenatal Vit-Fe Fumarate-FA (PRENATAL PO) Take by mouth.     aspirin  81 MG chewable tablet Chew 1 tablet (81 mg total) by mouth daily.      Allergies  Allergen Reactions   Watermelon [Citrullus Vulgaris] Anaphylaxis    Social History   Socioeconomic  History   Marital status: Significant Other    Spouse name: Not on file   Number of children: 0   Years of education: Not on file   Highest education level: Not on file  Occupational History   Occupation: Field Seismologist    Employer: [employer]  Tobacco Use   Smoking status: Former    Current packs/day: 1.00    Types: Cigars, Cigarettes   Smokeless tobacco: Never  Vaping Use   Vaping status: Never Used  Substance and Sexual Activity   Alcohol use: No   Drug use: No   Sexual activity: Yes    Partners: Male    Birth control/protection: None  Other Topics Concern   Not on file  Social History Narrative   Not on file   Social Drivers of Health   Financial Resource Strain: Low Risk (04/24/2024)   Received from [facility]   Overall Financial Resource Strain (CARDIA)    How hard is it for you to pay for the very basics like food, housing, medical care, and heating?: Not hard at all  Recent Concern: Financial Resource Strain - Medium Risk (04/03/2024)   Overall Financial Resource Strain (CARDIA)    Difficulty of Paying Living Expenses: Somewhat hard  Food Insecurity: No Food Insecurity (08/11/2024)   Received from [facility]   Hunger Vital Sign    Within the past 12 months, you worried that your food would run out before you got the money to buy more.: Never true  Within the past 12 months, the food you bought just didn't last and you didn't have money to get more.: Never true  Transportation Needs: No Transportation Needs (08/11/2024)   Received from [facility] - Transportation    Lack of Transportation (Medical): No    Lack of Transportation (Non-Medical): No  Physical Activity: Unknown (04/03/2024)   Exercise Vital Sign    Days of Exercise per Week: Not on file    Minutes of Exercise per Session: 150+ min  Stress: No Stress Concern Present (04/03/2024)   [employer] of Occupational [employer] - Occupational Stress Questionnaire     Feeling of Stress : Not at all  Social Connections: Unknown (04/03/2024)   Social Connection and Isolation Panel    Frequency of Communication with Friends and Family: More than three times a week    Frequency of Social Gatherings with Friends and Family: More than three times a week    Attends Religious Services: More than 4 times per year    Active Member of [employer] or Organizations: No    Attends Banker Meetings: Never    Marital Status: Not on file  Intimate Partner Violence: Not At Risk (04/03/2024)   Humiliation, Afraid, Rape, and Kick questionnaire    Fear of Current or Ex-Partner: No    Emotionally Abused: No    Physically Abused: No    Sexually Abused: No    Family History  Problem Relation Age of Onset   Diabetes Mother    Hypertension Mother    Sarcoidosis Mother    Alcohol abuse Maternal Grandmother    Diabetes Maternal Grandmother    Seizures Maternal Grandmother      Review of Systems  Constitutional:  Negative for chills and fever.  HENT:  Negative for congestion, ear discharge, ear pain, hearing loss, sinus pain and sore throat.   Eyes:  Negative for blurred vision and double vision.  Respiratory:  Negative for cough, shortness of breath and wheezing.   Cardiovascular:  Negative for chest pain, palpitations and leg swelling.  Gastrointestinal:  Negative for abdominal pain, blood in stool, constipation, diarrhea, heartburn, melena, nausea and vomiting.  Genitourinary:  Negative for dysuria, flank pain, frequency, hematuria and urgency.       Positive for pubis area pain  Musculoskeletal:  Positive for back pain and myalgias. Negative for joint pain.  Skin:  Negative for itching and rash.  Neurological:  Negative for dizziness, tingling, tremors, sensory change, speech change, focal weakness, seizures, loss of consciousness, weakness and headaches.  Endo/Heme/Allergies:  Negative for environmental allergies. Does not bruise/bleed easily.   Psychiatric/Behavioral:  Negative for depression, hallucinations, memory loss, substance abuse and suicidal ideas. The patient is not nervous/anxious and does not have insomnia.      Physical Exam: BP 123/68 (BP Location: Right Arm)   Pulse 93   Temp 98.2 F (36.8 C) (Oral)   Resp 17   LMP 02/02/2024 (Exact Date)   Constitutional: Well nourished, well developed female in mild acute distress.  HEENT: normal Skin: Warm and dry.  Cardiovascular: Regular rate and rhythm.   Extremity: no edema Respiratory:  Normal respiratory effort Abdomen: FHT present Back: no CVAT Psych: Alert and Oriented x3. No memory deficits. Normal mood and affect.   Fetal well being: reactive tracing, 135 bpm, moderate variability, +accelerations, -decelerations Toco: irregular mild contractions and some uterine irritability  Consults: None  Significant Findings/ Diagnostic Studies: none  Procedures: NST  Hospital Course: The patient was  admitted to Labor and Delivery Triage for observation.   Discharge Condition: good  Disposition: Discharge disposition: 01-Home or Self Care  Diet: Regular diet  Discharge Activity: Activity as tolerated  Discharge Instructions     Discharge activity:  No Restrictions   Complete by: As directed    Activity as tolerated   Discharge diet:  No restrictions   Complete by: As directed       Allergies as of 10/12/2024       Reactions   Watermelon [citrullus Vulgaris] Anaphylaxis        Medication List     TAKE these medications    aspirin  81 MG chewable tablet Chew 1 tablet (81 mg total) by mouth daily.   oxyCODONE  5 MG immediate release tablet Commonly known as: Roxicodone  Take 1 tablet (5 mg total) by mouth every 6 (six) hours as needed for up to 2 days for severe pain (pain score 7-10).   PRENATAL PO Take by mouth.         Total time spent taking care of this patient: 30 minutes  Signed: Slater Rains, CNM  10/12/2024, 10:27 PM
